# Patient Record
Sex: Male | Born: 1945
Health system: Southern US, Community
[De-identification: ages and names within clinical notes are randomized; demographics above are authoritative.]

## PROBLEM LIST (undated history)

## (undated) DIAGNOSIS — E119 Type 2 diabetes mellitus without complications: Secondary | ICD-10-CM

## (undated) DIAGNOSIS — E785 Hyperlipidemia, unspecified: Secondary | ICD-10-CM

## (undated) DIAGNOSIS — I10 Essential (primary) hypertension: Secondary | ICD-10-CM

## (undated) HISTORY — DX: Type 2 diabetes mellitus without complications: E11.9

## (undated) HISTORY — DX: Hyperlipidemia, unspecified: E78.5

## (undated) HISTORY — DX: Essential (primary) hypertension: I10

## (undated) HISTORY — PX: FOOT SURGERY: SHX648

---

## 2002-06-13 ENCOUNTER — Emergency Department (HOSPITAL_COMMUNITY): Admission: EM | Admit: 2002-06-13 | Discharge: 2002-06-13 | Payer: Self-pay | Admitting: Emergency Medicine

## 2004-09-15 ENCOUNTER — Other Ambulatory Visit: Payer: Self-pay

## 2004-09-15 ENCOUNTER — Emergency Department: Payer: Self-pay | Admitting: Internal Medicine

## 2004-10-25 ENCOUNTER — Ambulatory Visit: Payer: Self-pay | Admitting: Internal Medicine

## 2004-11-21 ENCOUNTER — Ambulatory Visit: Payer: Self-pay | Admitting: Internal Medicine

## 2006-08-24 ENCOUNTER — Emergency Department: Payer: Self-pay | Admitting: Emergency Medicine

## 2006-10-16 ENCOUNTER — Ambulatory Visit: Payer: Self-pay | Admitting: Orthopedic Surgery

## 2007-05-21 ENCOUNTER — Ambulatory Visit: Payer: Self-pay | Admitting: General Surgery
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
## 2009-01-12 ENCOUNTER — Emergency Department: Payer: Self-pay | Admitting: Emergency Medicine

## 2012-01-03 ENCOUNTER — Ambulatory Visit: Payer: Self-pay | Admitting: Family
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
## 2015-05-17 ENCOUNTER — Ambulatory Visit (INDEPENDENT_AMBULATORY_CARE_PROVIDER_SITE_OTHER): Payer: Commercial Managed Care - HMO | Admitting: Family Medicine

## 2015-05-17 ENCOUNTER — Encounter: Payer: Self-pay | Admitting: Family Medicine

## 2015-05-17 VITALS — BP 108/73 | HR 62 | Temp 98.2°F | Resp 18 | Ht 67.0 in | Wt 141.3 lb

## 2015-05-17 DIAGNOSIS — I10 Essential (primary) hypertension: Secondary | ICD-10-CM | POA: Diagnosis not present

## 2015-05-17 DIAGNOSIS — E119 Type 2 diabetes mellitus without complications: Secondary | ICD-10-CM

## 2015-05-17 DIAGNOSIS — Z794 Long term (current) use of insulin: Principal | ICD-10-CM

## 2015-05-17 DIAGNOSIS — F17200 Nicotine dependence, unspecified, uncomplicated: Secondary | ICD-10-CM | POA: Insufficient documentation

## 2015-05-17 DIAGNOSIS — E785 Hyperlipidemia, unspecified: Secondary | ICD-10-CM

## 2015-05-17 MED ORDER — INSULIN LISPRO 100 UNIT/ML (KWIKPEN): 4.0000 [IU] | PEN_INJECTOR | Freq: Two times a day (BID) | SUBCUTANEOUS | Status: DC

## 2015-05-17 MED ORDER — INSULIN GLARGINE 100 UNIT/ML SOLOSTAR PEN: 9.0000 [IU] | PEN_INJECTOR | Freq: Every day | SUBCUTANEOUS | Status: DC

## 2015-05-17 MED ORDER — METFORMIN HCL 1000 MG PO TABS: 1000.0000 mg | ORAL_TABLET | Freq: Two times a day (BID) | ORAL | Status: DC

## 2015-05-17 NOTE — Progress Notes (Signed)
Name: Larry Robinson   MRN: [id number]    DOB: 1946-03-25   Date:05/17/2015       Progress Note  Subjective  Chief Complaint  Chief Complaint  Patient presents with  . Medication Refill    Lantus 100 Unit / Humalog 100 Unit / Metformin 1000mg   . Diabetes    Diabetes He presents for his follow-up diabetic visit. He has type 2 diabetes mellitus. Pertinent negatives for hypoglycemia include no headaches. Pertinent negatives for diabetes include no blurred vision, no chest pain and no weight loss. Pertinent negatives for diabetic complications include no CVA. Current diabetic treatment includes oral agent (monotherapy) and intensive insulin program. His weight is stable. He is following a diabetic and generally healthy diet. He participates in exercise daily. His breakfast blood glucose range is generally 130-140 mg/dl. An ACE inhibitor/angiotensin II receptor blocker is being taken. Eye exam is current.  Hypertension This is a chronic problem. The problem is controlled. Pertinent negatives include no blurred vision, chest pain, headaches, malaise/fatigue, orthopnea, palpitations or shortness of breath. Risk factors for coronary artery disease include dyslipidemia, diabetes mellitus and male gender. Past treatments include ACE inhibitors. There is no history of kidney disease, CAD/MI or CVA.  Hyperlipidemia This is a chronic problem. The problem is controlled. Exacerbating diseases include diabetes. Pertinent negatives include no chest pain, leg pain, myalgias or shortness of breath. Current antihyperlipidemic treatment includes statins.      Past Medical History  Diagnosis Date  . Diabetes mellitus without complication   . Hypertension     Past Surgical History  Procedure Laterality Date  . Foot surgery Right     Family History  Problem Relation Age of Onset  . Hypertension Mother   . Diabetes Father     Social History   Social History  . Marital Status: Single    Spouse  Name: N/A  . Number of Children: N/A  . Years of Education: N/A   Occupational History  . Not on file.   Social History Main Topics  . Smoking status: Current Every Day Smoker -- 10.00 packs/day    Types: Cigarettes  . Smokeless tobacco: Never Used  . Alcohol Use: 0.0 oz/week    0 Standard drinks or equivalent per week     Comment: occasional  . Drug Use: No  . Sexual Activity: Not on file   Other Topics Concern  . Not on file   Social History Narrative  . No narrative on file     Current outpatient prescriptions:  .  Insulin Glargine (LANTUS SOLOSTAR) 100 UNIT/ML Solostar Pen, Inject into the skin., Disp: , Rfl:  .  insulin lispro (HUMALOG KWIKPEN) 100 UNIT/ML KiwkPen, Inject into the skin., Disp: , Rfl:  .  lisinopril (PRINIVIL,ZESTRIL) 10 MG tablet, Take by mouth., Disp: , Rfl:  .  metFORMIN (GLUCOPHAGE) 1000 MG tablet, Take by mouth., Disp: , Rfl:  .  simvastatin (ZOCOR) 20 MG tablet, Take 1 tablet by mouth at bedtime., Disp: , Rfl:   No Known Allergies   Review of Systems  Constitutional: Negative for fever, chills, weight loss and malaise/fatigue.  Eyes: Negative for blurred vision and double vision.  Respiratory: Negative for shortness of breath.   Cardiovascular: Negative for chest pain, palpitations and orthopnea.  Musculoskeletal: Negative for myalgias.  Neurological: Negative for headaches.      Objective  Filed Vitals:   05/17/15 1207  BP: 108/73  Pulse: 62  Temp: 98.2 F (36.8 C)  TempSrc: Oral  Resp:  18  Height:  (1.702 m)  Weight: 141 lb 4.8 oz (64.093 kg)  SpO2: 96%    Physical Exam  Constitutional: He is oriented to person, place, and time and well-developed, well-nourished, and in no distress.  Cardiovascular: Normal rate and regular rhythm.   Pulmonary/Chest: Effort normal and breath sounds normal.  Abdominal: Soft. Bowel sounds are normal.  Neurological: He is alert and oriented to person, place, and time.  Nursing note and  vitals reviewed.   Assessment & Plan  1. Controlled type 2 diabetes mellitus with insulin therapy  - HgB A1c - Insulin Glargine (LANTUS SOLOSTAR) 100 UNIT/ML Solostar Pen; Inject 9 Units into the skin daily at 10 pm.  Dispense: 15 mL; Refill: 2 - insulin lispro (HUMALOG KWIKPEN) 100 UNIT/ML KiwkPen; Inject 0.04 mLs (4 Units total) into the skin 2 (two) times daily.  Dispense: 15 mL; Refill: 1 - metFORMIN (GLUCOPHAGE) 1000 MG tablet; Take 1 tablet (1,000 mg total) by mouth 2 (two) times daily with a meal.  Dispense: 180 tablet; Refill: 0 - Urine Microalbumin w/creat. ratio  2. Essential hypertension Blood pressure is stable and controlled on present therapy.  3. Dyslipidemia  - Lipid Profile - Comprehensive metabolic panel   Leianne Callins [facility] 05/17/2015 12:17 PM

## 2015-05-19 LAB — COMPREHENSIVE METABOLIC PANEL
ALT: 14 IU/L (ref 0–44)
AST: 16 IU/L (ref 0–40)
Albumin/Globulin Ratio: 1.7 (ref 1.1–2.5)
Albumin: 4.2 g/dL (ref 3.6–4.8)
Alkaline Phosphatase: 57 IU/L (ref 39–117)
BUN/Creatinine Ratio: 10 (ref 10–22)
BUN: 13 mg/dL (ref 8–27)
Bilirubin Total: 0.5 mg/dL (ref 0.0–1.2)
CALCIUM: 9.5 mg/dL (ref 8.6–10.2)
CO2: 24 mmol/L (ref 18–29)
CREATININE: 1.32 mg/dL — AB (ref 0.76–1.27)
Chloride: 95 mmol/L — ABNORMAL LOW (ref 97–108)
GFR calc Af Amer: 64 mL/min/{1.73_m2} (ref >59)
GFR, EST NON AFRICAN AMERICAN: 55 mL/min/{1.73_m2} — AB (ref >59)
GLUCOSE: 263 mg/dL — AB (ref 65–99)
Globulin, Total: 2.5 g/dL (ref 1.5–4.5)
POTASSIUM: 4.9 mmol/L (ref 3.5–5.2)
Sodium: 136 mmol/L (ref 134–144)
TOTAL PROTEIN: 6.7 g/dL (ref 6.0–8.5)

## 2015-05-19 LAB — LIPID PANEL
Chol/HDL Ratio: 2.6 ratio units (ref 0.0–5.0)
Cholesterol, Total: 176 mg/dL (ref 100–199)
HDL: 68 mg/dL (ref >39)
LDL CALC: 93 mg/dL (ref 0–99)
Triglycerides: 74 mg/dL (ref 0–149)
VLDL Cholesterol Cal: 15 mg/dL (ref 5–40)

## 2015-05-19 LAB — HEMOGLOBIN A1C
Est. average glucose Bld gHb Est-mCnc: 260 mg/dL
HEMOGLOBIN A1C: 10.7 % — AB (ref 4.8–5.6)

## 2015-05-19 LAB — MICROALBUMIN / CREATININE URINE RATIO
CREATININE, UR: 173 mg/dL
MICROALB/CREAT RATIO: 19.2 mg/g creat (ref 0.0–30.0)
Microalbumin, Urine: 33.2 ug/mL

## 2015-05-25 ENCOUNTER — Encounter: Payer: Self-pay | Admitting: Family Medicine

## 2015-06-08 ENCOUNTER — Ambulatory Visit: Payer: Self-pay | Admitting: Family Medicine

## 2015-06-14 ENCOUNTER — Encounter: Payer: Self-pay | Admitting: Family Medicine

## 2015-06-14 ENCOUNTER — Ambulatory Visit (INDEPENDENT_AMBULATORY_CARE_PROVIDER_SITE_OTHER): Payer: Commercial Managed Care - HMO | Admitting: Family Medicine

## 2015-06-14 VITALS — BP 106/78 | HR 63 | Temp 97.9°F | Resp 18 | Ht 67.0 in | Wt 144.1 lb

## 2015-06-14 DIAGNOSIS — Z9181 History of falling: Secondary | ICD-10-CM | POA: Insufficient documentation

## 2015-06-14 DIAGNOSIS — E1165 Type 2 diabetes mellitus with hyperglycemia: Secondary | ICD-10-CM | POA: Diagnosis not present

## 2015-06-14 DIAGNOSIS — IMO0002 Reserved for concepts with insufficient information to code with codable children: Secondary | ICD-10-CM

## 2015-06-14 DIAGNOSIS — Z794 Long term (current) use of insulin: Principal | ICD-10-CM

## 2015-06-14 MED ORDER — INSULIN GLARGINE 100 UNIT/ML SOLOSTAR PEN: 15.0000 [IU] | PEN_INJECTOR | Freq: Every day | SUBCUTANEOUS | Status: DC

## 2015-06-14 NOTE — Progress Notes (Signed)
Name: Larry Robinson   MRN: [id number]    DOB: 09-06-1946   Date:06/14/2015       Progress Note  Subjective  Chief Complaint  Chief Complaint  Patient presents with  . Follow-up    adjust medications  . Diabetes  . Hyperlipidemia  . Hypertension    Diabetes He presents for his follow-up diabetic visit. He has type 2 diabetes mellitus. Pertinent negatives for hypoglycemia include no dizziness, hunger, nervousness/anxiousness or sweats. Associated symptoms include polydipsia. Pertinent negatives for diabetes include no fatigue and no polyuria. There are no hypoglycemic complications. Pertinent negatives for diabetic complications include no CVA, heart disease, nephropathy or retinopathy. Current diabetic treatment includes intensive insulin program and oral agent (monotherapy). He is following a diabetic and generally healthy diet. His breakfast blood glucose range is generally 140-180 mg/dl. His lunch blood glucose range is generally 140-180 mg/dl. An ACE inhibitor/angiotensin II receptor blocker is being taken.   last A1c of 10.7%, patient reports a.m. blood glucose readings in the 140s to 180s range. Currently on basal and bolus insulin along with metformin. No hypoglycemic episodes.  Past Medical History  Diagnosis Date  . Diabetes mellitus without complication   . Hypertension     Past Surgical History  Procedure Laterality Date  . Foot surgery Right     Family History  Problem Relation Age of Onset  . Hypertension Mother   . Diabetes Father     Social History   Social History  . Marital Status: Single    Spouse Name: N/A  . Number of Children: N/A  . Years of Education: N/A   Occupational History  . Not on file.   Social History Main Topics  . Smoking status: Current Every Day Smoker -- 10.00 packs/day    Types: Cigarettes  . Smokeless tobacco: Never Used  . Alcohol Use: 0.0 oz/week    0 Standard drinks or equivalent per week     Comment: occasional  . Drug  Use: No  . Sexual Activity: Not on file   Other Topics Concern  . Not on file   Social History Narrative     Current outpatient prescriptions:  .  AFLURIA PRESERVATIVE FREE 0.5 ML SUSY, inject 0.5 milliliter intramuscularly, Disp: , Rfl: 0 .  Insulin Glargine (LANTUS SOLOSTAR) 100 UNIT/ML Solostar Pen, Inject 9 Units into the skin daily at 10 pm., Disp: 15 mL, Rfl: 2 .  insulin lispro (HUMALOG KWIKPEN) 100 UNIT/ML KiwkPen, Inject 0.04 mLs (4 Units total) into the skin 2 (two) times daily., Disp: 15 mL, Rfl: 1 .  lisinopril (PRINIVIL,ZESTRIL) 10 MG tablet, Take by mouth., Disp: , Rfl:  .  metFORMIN (GLUCOPHAGE) 1000 MG tablet, Take 1 tablet (1,000 mg total) by mouth 2 (two) times daily with a meal., Disp: 180 tablet, Rfl: 0 .  simvastatin (ZOCOR) 20 MG tablet, Take 1 tablet by mouth at bedtime., Disp: , Rfl:   No Known Allergies   Review of Systems  Constitutional: Negative for fatigue.  Neurological: Negative for dizziness.  Endo/Heme/Allergies: Positive for polydipsia.  Psychiatric/Behavioral: The patient is not nervous/anxious.     Objective  Filed Vitals:   06/14/15 0947  BP: 106/78  Pulse: 63  Temp: 97.9 F (36.6 C)  TempSrc: Oral  Resp: 18  Height:  (1.702 m)  Weight: 144 lb 1.6 oz (65.363 kg)  SpO2: 96%    Physical Exam  Constitutional: He is well-developed, well-nourished, and in no distress.  HENT:  Head: Normocephalic and atraumatic.  Cardiovascular: Normal rate and regular rhythm.   Pulmonary/Chest: Effort normal and breath sounds normal.  Nursing note and vitals reviewed.   Assessment & Plan  1. Uncontrolled type 2 diabetes mellitus with insulin therapy Recommended increasing Lantus to 15 units at bedtime, up from his current dose of 9 units. He is to increase by 1 unit every night and closely monitor blood glucose following morning. Review blood glucose logs in one week. Patient is aware of symptoms of hypoglycemia. Recommended maintaining  dietary and lifestyle measures for optimal diabetes control. - Insulin Glargine (LANTUS SOLOSTAR) 100 UNIT/ML Solostar Pen; Inject 15 Units into the skin daily at 10 pm.  Dispense: 15 mL; Refill: 2   Syed [facility] 06/14/2015 10:06 AM

## 2015-06-21 ENCOUNTER — Telehealth: Payer: Self-pay | Admitting: Family Medicine

## 2015-06-21 NOTE — Telephone Encounter (Signed)
Routed to Dr. Shah for advice  

## 2015-06-21 NOTE — Telephone Encounter (Signed)
Pt called wanting to get a update on the [facility] paperwork he brought in last week to be filled out. Pt would like to know how much longer it will be?

## 2015-06-23 NOTE — Telephone Encounter (Signed)
[facility] paperwork completed and ready for pickup

## 2015-07-13 ENCOUNTER — Encounter: Payer: Self-pay | Admitting: Family Medicine

## 2015-07-13 ENCOUNTER — Ambulatory Visit (INDEPENDENT_AMBULATORY_CARE_PROVIDER_SITE_OTHER): Payer: Commercial Managed Care - HMO | Admitting: Family Medicine

## 2015-07-13 DIAGNOSIS — IMO0002 Reserved for concepts with insufficient information to code with codable children: Secondary | ICD-10-CM

## 2015-07-13 DIAGNOSIS — Z794 Long term (current) use of insulin: Secondary | ICD-10-CM

## 2015-07-13 DIAGNOSIS — E1165 Type 2 diabetes mellitus with hyperglycemia: Secondary | ICD-10-CM | POA: Diagnosis not present

## 2015-07-13 MED ORDER — INSULIN GLARGINE 100 UNIT/ML SOLOSTAR PEN: 15.0000 [IU] | PEN_INJECTOR | Freq: Every day | SUBCUTANEOUS | Status: DC

## 2015-07-13 MED ORDER — INSULIN LISPRO 100 UNIT/ML (KWIKPEN): 4.0000 [IU] | PEN_INJECTOR | Freq: Two times a day (BID) | SUBCUTANEOUS | Status: DC

## 2015-07-13 NOTE — Progress Notes (Signed)
Name: Larry GauzeKenneth S Feliciano   MRN: [id number]    DOB: November 14, 1945   Date:07/13/2015       Progress Note  Subjective  Chief Complaint  Chief Complaint  Patient presents with  . Follow-up    1 mo  . Diabetes  . Hyperlipidemia  . Hypertension  . Medication Refill    humalog; lantus    Diabetes He presents for his follow-up diabetic visit. He has type 2 diabetes mellitus. His disease course has been improving. Associated symptoms include polydipsia and polyuria. Pertinent negatives for diabetes include no fatigue. Current diabetic treatment includes intensive insulin program and oral agent (monotherapy). He is following a diabetic diet. His breakfast blood glucose range is generally 140-180 mg/dl. An ACE inhibitor/angiotensin II receptor blocker is being taken.   Past Medical History  Diagnosis Date  . Diabetes mellitus without complication (HCC)   . Hypertension     Past Surgical History  Procedure Laterality Date  . Foot surgery Right     Family History  Problem Relation Age of Onset  . Hypertension Mother   . Diabetes Father     Social History   Social History  . Marital Status: Single    Spouse Name: N/A  . Number of Children: N/A  . Years of Education: N/A   Occupational History  . Not on file.   Social History Main Topics  . Smoking status: Current Every Day Smoker -- 10.00 packs/day    Types: Cigarettes  . Smokeless tobacco: Never Used  . Alcohol Use: 0.0 oz/week    0 Standard drinks or equivalent per week     Comment: occasional  . Drug Use: No  . Sexual Activity: Not on file   Other Topics Concern  . Not on file   Social History Narrative    Current outpatient prescriptions:  .  AFLURIA PRESERVATIVE FREE 0.5 ML SUSY, inject 0.5 milliliter intramuscularly, Disp: , Rfl: 0 .  Insulin Glargine (LANTUS SOLOSTAR) 100 UNIT/ML Solostar Pen, Inject 15 Units into the skin daily at 10 pm., Disp: 15 mL, Rfl: 2 .  insulin lispro (HUMALOG KWIKPEN) 100 UNIT/ML  KiwkPen, Inject 0.04 mLs (4 Units total) into the skin 2 (two) times daily., Disp: 15 mL, Rfl: 1 .  lisinopril (PRINIVIL,ZESTRIL) 10 MG tablet, Take by mouth., Disp: , Rfl:  .  metFORMIN (GLUCOPHAGE) 1000 MG tablet, Take 1 tablet (1,000 mg total) by mouth 2 (two) times daily with a meal., Disp: 180 tablet, Rfl: 0 .  simvastatin (ZOCOR) 20 MG tablet, Take 1 tablet by mouth at bedtime., Disp: , Rfl:   No Known Allergies   Review of Systems  Constitutional: Negative for fatigue.  Endo/Heme/Allergies: Positive for polydipsia.    Objective  Filed Vitals:   07/13/15 1358  BP: 108/68  Pulse: 72  Temp: 98.4 F (36.9 C)  TempSrc: Oral  Resp: 18  Height: 5\' 7"  (1.702 m)  Weight: 138 lb 12.8 oz (62.959 kg)  SpO2: 97%    Physical Exam  Constitutional: He is well-developed, well-nourished, and in no distress.  Cardiovascular: Normal rate and regular rhythm.   No murmur heard. Pulmonary/Chest: Breath sounds normal. He has no wheezes. He has no rales.  Nursing note and vitals reviewed.  Assessment & Plan  1. Uncontrolled type 2 diabetes mellitus with insulin therapy (HCC) Fasting BG readings seem to be improving on the higher dose of Lantus. Recheck A1c in 3 months. Refills for Lantus and Humalog sent to pharmacy. - insulin lispro (HUMALOG KWIKPEN) 100  UNIT/ML KiwkPen; Inject 0.04 mLs (4 Units total) into the skin 2 (two) times daily.  Dispense: 3 pen; Refill: 2 - Insulin Glargine (LANTUS SOLOSTAR) 100 UNIT/ML Solostar Pen; Inject 15 Units into the skin daily at 10 pm.  Dispense: 3 pen; Refill: 2    Arvilla Salada [facility] 07/13/2015 2:02 PM

## 2015-08-15 ENCOUNTER — Encounter: Payer: Self-pay | Admitting: Family Medicine

## 2015-08-15 ENCOUNTER — Ambulatory Visit (INDEPENDENT_AMBULATORY_CARE_PROVIDER_SITE_OTHER): Payer: Commercial Managed Care - HMO | Admitting: Family Medicine

## 2015-08-15 DIAGNOSIS — E1140 Type 2 diabetes mellitus with diabetic neuropathy, unspecified: Secondary | ICD-10-CM | POA: Insufficient documentation

## 2015-08-15 DIAGNOSIS — Z794 Long term (current) use of insulin: Secondary | ICD-10-CM | POA: Diagnosis not present

## 2015-08-15 DIAGNOSIS — E785 Hyperlipidemia, unspecified: Secondary | ICD-10-CM | POA: Diagnosis not present

## 2015-08-15 DIAGNOSIS — I10 Essential (primary) hypertension: Secondary | ICD-10-CM

## 2015-08-15 DIAGNOSIS — E119 Type 2 diabetes mellitus without complications: Secondary | ICD-10-CM | POA: Diagnosis not present

## 2015-08-15 DIAGNOSIS — N521 Erectile dysfunction due to diseases classified elsewhere: Secondary | ICD-10-CM

## 2015-08-15 LAB — GLUCOSE, POCT (MANUAL RESULT ENTRY): POC Glucose: 122 mg/dl — AB (ref 70–99)

## 2015-08-15 LAB — POCT GLYCOSYLATED HEMOGLOBIN (HGB A1C): HEMOGLOBIN A1C: 6.4

## 2015-08-15 MED ORDER — METFORMIN HCL 1000 MG PO TABS: 1000.0000 mg | ORAL_TABLET | Freq: Two times a day (BID) | ORAL | Status: DC

## 2015-08-15 MED ORDER — INSULIN GLARGINE 100 UNIT/ML SOLOSTAR PEN: 10.0000 [IU] | PEN_INJECTOR | Freq: Every day | SUBCUTANEOUS | Status: DC

## 2015-08-15 NOTE — Progress Notes (Signed)
Name: Larry GauzeKenneth S Robinson   MRN: [id number]    DOB: 04/25/1946   Date:08/15/2015       Progress Note  Subjective  Chief Complaint  Chief Complaint  Patient presents with  . Annual Exam    CPE  . Diabetes  . Hyperlipidemia  . Hypertension    Diabetes He presents for his follow-up diabetic visit. He has type 2 diabetes mellitus. His disease course has been stable. There are no hypoglycemic associated symptoms. Pertinent negatives for hypoglycemia include no headaches. Pertinent negatives for diabetes include no blurred vision and no chest pain. Pertinent negatives for diabetic complications include no CVA or heart disease. He is currently taking insulin pre-breakfast, at bedtime and pre-lunch. Insulin injections are given by patient. Rotation sites for injection include the abdominal wall. His weight is stable. He is following a diabetic and generally healthy diet. He participates in exercise three times a week (walks 3x/week for 30 mins each). His breakfast blood glucose range is generally 130-140 mg/dl. An ACE inhibitor/angiotensin II receptor blocker is being taken. He sees a podiatrist.Eye exam is current.  Hyperlipidemia This is a chronic problem. The problem is controlled. Recent lipid tests were reviewed and are normal. Pertinent negatives include no chest pain, leg pain, myalgias or shortness of breath. Current antihyperlipidemic treatment includes statins. The current treatment provides significant improvement of lipids. There are no compliance problems.  Risk factors for coronary artery disease include diabetes mellitus and dyslipidemia.  Hypertension This is a chronic problem. The problem is unchanged. The problem is controlled. Pertinent negatives include no blurred vision, chest pain, headaches, palpitations or shortness of breath. Past treatments include ACE inhibitors. There is no history of kidney disease, CAD/MI or CVA.    Past Medical History  Diagnosis Date  . Diabetes  mellitus without complication (HCC)   . Hypertension     Past Surgical History  Procedure Laterality Date  . Foot surgery Right     Family History  Problem Relation Age of Onset  . Hypertension Mother   . Diabetes Father     Social History   Social History  . Marital Status: Single    Spouse Name: N/A  . Number of Children: N/A  . Years of Education: N/A   Occupational History  . Not on file.   Social History Main Topics  . Smoking status: Current Every Day Smoker -- 10.00 packs/day    Types: Cigarettes  . Smokeless tobacco: Never Used  . Alcohol Use: 0.0 oz/week    0 Standard drinks or equivalent per week     Comment: occasional  . Drug Use: No  . Sexual Activity: Not on file   Other Topics Concern  . Not on file   Social History Narrative     Current outpatient prescriptions:  .  AFLURIA PRESERVATIVE FREE 0.5 ML SUSY, inject 0.5 milliliter intramuscularly, Disp: , Rfl: 0 .  Insulin Glargine (LANTUS SOLOSTAR) 100 UNIT/ML Solostar Pen, Inject 15 Units into the skin daily at 10 pm., Disp: 3 pen, Rfl: 2 .  insulin lispro (HUMALOG KWIKPEN) 100 UNIT/ML KiwkPen, Inject 0.04 mLs (4 Units total) into the skin 2 (two) times daily., Disp: 3 pen, Rfl: 2 .  lisinopril (PRINIVIL,ZESTRIL) 10 MG tablet, Take by mouth., Disp: , Rfl:  .  metFORMIN (GLUCOPHAGE) 1000 MG tablet, Take 1 tablet (1,000 mg total) by mouth 2 (two) times daily with a meal., Disp: 180 tablet, Rfl: 0 .  simvastatin (ZOCOR) 20 MG tablet, Take 1 tablet by mouth  at bedtime., Disp: , Rfl:   No Known Allergies   Review of Systems  Eyes: Negative for blurred vision.  Respiratory: Negative for shortness of breath.   Cardiovascular: Negative for chest pain and palpitations.  Musculoskeletal: Negative for myalgias.  Neurological: Negative for headaches.  All other systems reviewed and are negative.   Objective  Filed Vitals:   08/15/15 1333  BP: 110/70  Pulse: 71  Temp: 98.5 F (36.9 C)  TempSrc:  Oral  Resp: 16  Height:  (1.702 m)  Weight: 140 lb 1.6 oz (63.549 kg)  SpO2: 96%    Physical Exam  Constitutional: He is oriented to person, place, and time and well-developed, well-nourished, and in no distress.  HENT:  Head: Normocephalic and atraumatic.  Eyes: Conjunctivae are normal. Pupils are equal, round, and reactive to light.  Cardiovascular: Normal rate, regular rhythm and normal heart sounds.   Pulmonary/Chest: Effort normal and breath sounds normal.  Abdominal: Soft. Bowel sounds are normal.  Musculoskeletal: He exhibits no edema.  Neurological: He is alert and oriented to person, place, and time.  Skin: Skin is warm and dry.  Nursing note and vitals reviewed.   Assessment & Plan  1. Dyslipidemia Obtain FLP and follow-up. - Lipid Profile - Comprehensive Metabolic Panel (CMET)  2. Essential hypertension BP well controlled on present therapy.  3. Controlled type 2 diabetes mellitus without complication, with long-term current use of insulin (HCC) A1c has improved from 10.7% is 6.4% since August 2016. Advised to decrease Lantus to 10 units at bedtime. Continue on same dosage of mealtime insulin and metformin. Continue with dietary and lifestyle therapy as well. Recheck A1c in 3-4 months. - POCT HgB A1C - POCT Glucose (CBG) - Insulin Glargine (LANTUS SOLOSTAR) 100 UNIT/ML Solostar Pen; Inject 10 Units into the skin daily at 10 pm.  Dispense: 3 pen; Refill: 2     Lysette Lindenbaum [facility] 08/15/2015 1:45 PM

## 2015-08-25 LAB — COMPREHENSIVE METABOLIC PANEL WITH GFR
ALT: 18 IU/L (ref 0–44)
AST: 19 IU/L (ref 0–40)
Albumin/Globulin Ratio: 1.6 (ref 1.1–2.5)
Albumin: 4 g/dL (ref 3.6–4.8)
Alkaline Phosphatase: 45 IU/L (ref 39–117)
BUN/Creatinine Ratio: 17 (ref 10–22)
BUN: 19 mg/dL (ref 8–27)
Bilirubin Total: 0.5 mg/dL (ref 0.0–1.2)
CO2: 27 mmol/L (ref 18–29)
Calcium: 9.2 mg/dL (ref 8.6–10.2)
Chloride: 98 mmol/L (ref 97–106)
Creatinine, Ser: 1.14 mg/dL (ref 0.76–1.27)
GFR calc Af Amer: 77 mL/min/1.73
GFR calc non Af Amer: 66 mL/min/1.73
Globulin, Total: 2.5 g/dL (ref 1.5–4.5)
Glucose: 178 mg/dL — ABNORMAL HIGH (ref 65–99)
Potassium: 5 mmol/L (ref 3.5–5.2)
Sodium: 137 mmol/L (ref 136–144)
Total Protein: 6.5 g/dL (ref 6.0–8.5)

## 2015-08-25 LAB — LIPID PANEL
Chol/HDL Ratio: 2.2 ratio units (ref 0.0–5.0)
Cholesterol, Total: 153 mg/dL (ref 100–199)
HDL: 70 mg/dL (ref >39)
LDL CALC: 67 mg/dL (ref 0–99)
Triglycerides: 78 mg/dL (ref 0–149)
VLDL CHOLESTEROL CAL: 16 mg/dL (ref 5–40)

## 2015-09-21 ENCOUNTER — Encounter: Payer: Commercial Managed Care - HMO | Admitting: Family Medicine

## 2015-10-16 ENCOUNTER — Other Ambulatory Visit: Payer: Self-pay | Admitting: Family Medicine

## 2015-10-18 NOTE — Telephone Encounter (Signed)
Medication has been refilled and sent to [facility] [address] 

## 2015-10-23 ENCOUNTER — Ambulatory Visit (INDEPENDENT_AMBULATORY_CARE_PROVIDER_SITE_OTHER): Payer: PPO | Admitting: Family Medicine

## 2015-10-23 ENCOUNTER — Encounter: Payer: Self-pay | Admitting: Family Medicine

## 2015-10-23 VITALS — BP 112/69 | HR 64 | Temp 97.8°F | Resp 17 | Ht 67.0 in | Wt 147.4 lb

## 2015-10-23 DIAGNOSIS — L02212 Cutaneous abscess of back [any part, except buttock]: Secondary | ICD-10-CM | POA: Diagnosis not present

## 2015-10-23 MED ORDER — CLINDAMYCIN HCL 300 MG PO CAPS: 300.0000 mg | ORAL_CAPSULE | Freq: Four times a day (QID) | ORAL | Status: DC

## 2015-10-23 NOTE — Progress Notes (Signed)
Name: Larry Robinson   MRN: [id number]    DOB: 1946/05/13   Date:10/23/2015       Progress Note  Subjective  Chief Complaint  Chief Complaint  Patient presents with  . Follow-up    1 MO  . Diabetes  . Hyperlipidemia    HPI  Pt. Presents for evaluation of a mass on his left upper back, noticed it 4 days ago, started as a small raised area, then he scratched it and noticed it got bigger and they are also noticed some discharge. Seen some blood on his back yesterday (presumably from the mass). No fevers, chills, nausea, vomiting, or other symptoms.   Past Medical History  Diagnosis Date  . Hypertension     Past Surgical History  Procedure Laterality Date  . Foot surgery Right     Family History  Problem Relation Age of Onset  . Hypertension Mother   . Diabetes Father     Social History   Social History  . Marital Status: Single    Spouse Name: N/A  . Number of Children: N/A  . Years of Education: N/A   Occupational History  . Not on file.   Social History Main Topics  . Smoking status: Current Every Day Smoker -- 10.00 packs/day    Types: Cigarettes  . Smokeless tobacco: Never Used  . Alcohol Use: 0.0 oz/week    0 Standard drinks or equivalent per week     Comment: occasional  . Drug Use: No  . Sexual Activity: Not on file   Other Topics Concern  . Not on file   Social History Narrative     Current outpatient prescriptions:  .  AFLURIA PRESERVATIVE FREE 0.5 ML SUSY, inject 0.5 milliliter intramuscularly, Disp: , Rfl: 0 .  Insulin Glargine (LANTUS SOLOSTAR) 100 UNIT/ML Solostar Pen, Inject 10 Units into the skin daily at 10 pm., Disp: 3 pen, Rfl: 2 .  insulin lispro (HUMALOG KWIKPEN) 100 UNIT/ML KiwkPen, Inject 0.04 mLs (4 Units total) into the skin 2 (two) times daily., Disp: 15 mL, Rfl: 2 .  lisinopril (PRINIVIL,ZESTRIL) 10 MG tablet, Take by mouth., Disp: , Rfl:  .  metFORMIN (GLUCOPHAGE) 1000 MG tablet, Take 1 tablet (1,000 mg total) by mouth 2  (two) times daily with a meal., Disp: 180 tablet, Rfl: 0 .  simvastatin (ZOCOR) 20 MG tablet, Take 1 tablet by mouth at bedtime., Disp: , Rfl:   No Known Allergies   Review of Systems  Constitutional: Negative for fever, chills, weight loss and malaise/fatigue.    Objective  Filed Vitals:   10/23/15 1051  BP: 112/69  Pulse: 64  Temp: 97.8 F (36.6 C)  TempSrc: Oral  Resp: 17  Height:  (1.702 m)  Weight: 147 lb 6.4 oz (66.86 kg)  SpO2: 93%    Physical Exam  Skin: Skin is warm. Lesion noted.  Round, raised, erythematous, non-tender, non draining lesion on the left upper back, appearance most c/w a non-fluctuant, indurated abscess  Nursing note and vitals reviewed.      Assessment & Plan  1. Abscess of back We will start on ABX therapy with MRSA coverage. If symptoms do not improve, he may be referred to dermatology for I&D. - clindamycin (CLEOCIN) 300 MG capsule; Take 1 capsule (300 mg total) by mouth 4 (four) times daily.  Dispense: 40 capsule; Refill: 0   Krishawn Vanderweele [facility] 10/23/2015 11:07 AM

## 2015-11-06 ENCOUNTER — Ambulatory Visit (INDEPENDENT_AMBULATORY_CARE_PROVIDER_SITE_OTHER): Payer: PPO | Admitting: Family Medicine

## 2015-11-06 ENCOUNTER — Encounter: Payer: Self-pay | Admitting: Family Medicine

## 2015-11-06 VITALS — BP 112/66 | HR 68 | Temp 99.0°F | Resp 17 | Ht 67.0 in | Wt 145.3 lb

## 2015-11-06 DIAGNOSIS — L02212 Cutaneous abscess of back [any part, except buttock]: Secondary | ICD-10-CM

## 2015-11-06 MED ORDER — CLINDAMYCIN HCL 300 MG PO CAPS: 300.0000 mg | ORAL_CAPSULE | Freq: Four times a day (QID) | ORAL | Status: DC

## 2015-11-06 NOTE — Progress Notes (Signed)
Name: Larry Robinson   MRN: [id number]    DOB: December 16, 1945   Date:11/06/2015       Progress Note  Subjective  Chief Complaint  Chief Complaint  Patient presents with  . Follow-up  . Diabetes  . Hyperlipidemia    HPI  Pt. Is here to reassess an abscess on his right upper back. Appeared spontaneously 2 weeks ago, scratched it and it bled. He was seen 2 weeks ago, was started on Clindamycin 300 mg 4 times daily for presumptive abscess. However, pt. Only took it for 4 days and then stopped.  He is here today for re-evaluation. The abscess looks better, no bleeding, no drainage, no pain on palpation, has gone down in size.  Reports no fevers or chills.  Past Medical History  Diagnosis Date  . Hypertension     Past Surgical History  Procedure Laterality Date  . Foot surgery Right     Family History  Problem Relation Age of Onset  . Hypertension Mother   . Diabetes Father     Social History   Social History  . Marital Status: Single    Spouse Name: N/A  . Number of Children: N/A  . Years of Education: N/A   Occupational History  . Not on file.   Social History Main Topics  . Smoking status: Current Every Day Smoker -- 10.00 packs/day    Types: Cigarettes  . Smokeless tobacco: Never Used  . Alcohol Use: 0.0 oz/week    0 Standard drinks or equivalent per week     Comment: occasional  . Drug Use: No  . Sexual Activity: Not on file   Other Topics Concern  . Not on file   Social History Narrative     Current outpatient prescriptions:  .  AFLURIA PRESERVATIVE FREE 0.5 ML SUSY, inject 0.5 milliliter intramuscularly, Disp: , Rfl: 0 .  Insulin Glargine (LANTUS SOLOSTAR) 100 UNIT/ML Solostar Pen, Inject 10 Units into the skin daily at 10 pm., Disp: 3 pen, Rfl: 2 .  insulin lispro (HUMALOG KWIKPEN) 100 UNIT/ML KiwkPen, Inject 0.04 mLs (4 Units total) into the skin 2 (two) times daily., Disp: 15 mL, Rfl: 2 .  lisinopril (PRINIVIL,ZESTRIL) 10 MG tablet, Take by  mouth., Disp: , Rfl:  .  metFORMIN (GLUCOPHAGE) 1000 MG tablet, Take 1 tablet (1,000 mg total) by mouth 2 (two) times daily with a meal., Disp: 180 tablet, Rfl: 0 .  simvastatin (ZOCOR) 20 MG tablet, Take 1 tablet by mouth at bedtime., Disp: , Rfl:  .  clindamycin (CLEOCIN) 300 MG capsule, Take 1 capsule (300 mg total) by mouth 4 (four) times daily. (Patient not taking: Reported on 11/06/2015), Disp: 40 capsule, Rfl: 0  No Known Allergies   Review of Systems  Constitutional: Negative for fever and chills.  Skin: Negative for itching and rash.     Objective  Filed Vitals:   11/06/15 1318  BP: 112/66  Pulse: 68  Temp: 99 F (37.2 C)  TempSrc: Oral  Resp: 17  Height:  (1.702 m)  Weight: 145 lb 4.8 oz (65.908 kg)  SpO2: 95%    Physical Exam  Skin: Lesion noted.     papulo-pustular erythematous, indurated, non-tender lesion on the right upper back, decreased in size and depth.  Nursing note and vitals reviewed.     Assessment & Plan  1. Abscess of back Restart Clindamycin due to interruption in therapy. Abscess improved. Follow up in 2 weeks. - clindamycin (CLEOCIN) 300 MG capsule; Take  1 capsule (300 mg total) by mouth 4 (four) times daily.  Dispense: 40 capsule; Refill: 0   Lorimer Tiberio [facility] 11/06/2015 1:25 PM

## 2015-11-20 ENCOUNTER — Encounter: Payer: Self-pay | Admitting: Family Medicine

## 2015-11-20 ENCOUNTER — Ambulatory Visit (INDEPENDENT_AMBULATORY_CARE_PROVIDER_SITE_OTHER): Payer: PPO | Admitting: Family Medicine

## 2015-11-20 VITALS — BP 110/62 | HR 69 | Temp 98.8°F | Resp 18 | Ht 67.0 in | Wt 140.5 lb

## 2015-11-20 DIAGNOSIS — Z794 Long term (current) use of insulin: Secondary | ICD-10-CM

## 2015-11-20 DIAGNOSIS — L02212 Cutaneous abscess of back [any part, except buttock]: Secondary | ICD-10-CM

## 2015-11-20 DIAGNOSIS — E119 Type 2 diabetes mellitus without complications: Secondary | ICD-10-CM | POA: Diagnosis not present

## 2015-11-20 LAB — POCT GLYCOSYLATED HEMOGLOBIN (HGB A1C): HEMOGLOBIN A1C: 6.5

## 2015-11-20 LAB — GLUCOSE, POCT (MANUAL RESULT ENTRY): POC GLUCOSE: 212 mg/dL — AB (ref 70–99)

## 2015-11-20 MED ORDER — INSULIN GLARGINE 100 UNIT/ML SOLOSTAR PEN: 10.0000 [IU] | PEN_INJECTOR | Freq: Every day | SUBCUTANEOUS | Status: DC

## 2015-11-20 MED ORDER — INSULIN ASPART 100 UNIT/ML FLEXPEN: 4.0000 [IU] | PEN_INJECTOR | Freq: Two times a day (BID) | SUBCUTANEOUS | Status: DC

## 2015-11-20 MED ORDER — METFORMIN HCL 1000 MG PO TABS: 1000.0000 mg | ORAL_TABLET | Freq: Two times a day (BID) | ORAL | Status: DC

## 2015-11-20 NOTE — Progress Notes (Signed)
Name: Larry Robinson   MRN: [id number]    DOB: 01-Aug-1946   Date:11/20/2015       Progress Note  Subjective  Chief Complaint  Chief Complaint  Patient presents with  . abscess on back    2 week follow up    Diabetes He presents for his follow-up diabetic visit. He has type 2 diabetes mellitus. His disease course has been stable. There are no hypoglycemic associated symptoms. Associated symptoms include polyuria. Pertinent negatives for diabetes include no blurred vision, no fatigue and no polydipsia. Symptoms are stable. Current diabetic treatment includes oral agent (monotherapy) and insulin injections. He is following a diabetic diet. His breakfast blood glucose range is generally 130-140 mg/dl. An ACE inhibitor/angiotensin II receptor blocker is being taken.     Past Medical History  Diagnosis Date  . Hypertension     Past Surgical History  Procedure Laterality Date  . Foot surgery Right     Family History  Problem Relation Age of Onset  . Hypertension Mother   . Diabetes Father     Social History   Social History  . Marital Status: Single    Spouse Name: N/A  . Number of Children: N/A  . Years of Education: N/A   Occupational History  . Not on file.   Social History Main Topics  . Smoking status: Current Every Day Smoker -- 10.00 packs/day    Types: Cigarettes  . Smokeless tobacco: Never Used  . Alcohol Use: 0.0 oz/week    0 Standard drinks or equivalent per week     Comment: occasional  . Drug Use: No  . Sexual Activity: Not on file   Other Topics Concern  . Not on file   Social History Narrative     Current outpatient prescriptions:  .  AFLURIA PRESERVATIVE FREE 0.5 ML SUSY, inject 0.5 milliliter intramuscularly, Disp: , Rfl: 0 .  clindamycin (CLEOCIN) 300 MG capsule, Take 1 capsule (300 mg total) by mouth 4 (four) times daily., Disp: 40 capsule, Rfl: 0 .  Insulin Glargine (LANTUS SOLOSTAR) 100 UNIT/ML Solostar Pen, Inject 10 Units into the skin  daily at 10 pm., Disp: 3 pen, Rfl: 2 .  insulin lispro (HUMALOG KWIKPEN) 100 UNIT/ML KiwkPen, Inject 0.04 mLs (4 Units total) into the skin 2 (two) times daily., Disp: 15 mL, Rfl: 2 .  lisinopril (PRINIVIL,ZESTRIL) 10 MG tablet, Take by mouth., Disp: , Rfl:  .  metFORMIN (GLUCOPHAGE) 1000 MG tablet, Take 1 tablet (1,000 mg total) by mouth 2 (two) times daily with a meal., Disp: 180 tablet, Rfl: 0 .  simvastatin (ZOCOR) 20 MG tablet, Take 1 tablet by mouth at bedtime., Disp: , Rfl:   No Known Allergies   Review of Systems  Constitutional: Negative for fatigue.  Eyes: Negative for blurred vision and double vision.  Gastrointestinal: Negative for abdominal pain.  Endo/Heme/Allergies: Negative for polydipsia.     Objective  Filed Vitals:   11/20/15 1600  BP: 110/62  Pulse: 69  Temp: 98.8 F (37.1 C)  Resp: 18  Height:  (1.702 m)  Weight: 140 lb 8 oz (63.73 kg)  SpO2: 99%    Physical Exam  Constitutional: He is oriented to person, place, and time and well-developed, well-nourished, and in no distress.  Cardiovascular: Normal rate and regular rhythm.   Pulmonary/Chest: Effort normal and breath sounds normal.  Neurological: He is alert and oriented to person, place, and time.  Skin: Skin is warm and dry. Lesion noted.  Healed abscess  on the left upper back, minimal induration, no drainage, surrounding erythema resolved, no tenderness to palpation.  Nursing note and vitals reviewed.     Assessment & Plan  1. Controlled type 2 diabetes mellitus without complication, with long-term current use of insulin (HCC) A1c at goal. Continue present basal and bolus insulin therapy along with metformin. Recheck in 3 months. - Insulin Glargine (LANTUS SOLOSTAR) 100 UNIT/ML Solostar Pen; Inject 10 Units into the skin daily at 10 pm.  Dispense: 3 pen; Refill: 2 - metFORMIN (GLUCOPHAGE) 1000 MG tablet; Take 1 tablet (1,000 mg total) by mouth 2 (two) times daily with a meal.  Dispense: 180  tablet; Refill: 0 - insulin aspart (NOVOLOG) 100 UNIT/ML FlexPen; Inject 4 Units into the skin 2 (two) times daily with a meal.  Dispense: 15 mL; Refill: 2  2. Abscess of back Resolved. Completed antibiotic therapy.   Almarie Kurdziel [facility] 11/20/2015 4:28 PM

## 2016-01-03 ENCOUNTER — Encounter: Payer: Self-pay | Admitting: Family Medicine

## 2016-01-03 ENCOUNTER — Ambulatory Visit (INDEPENDENT_AMBULATORY_CARE_PROVIDER_SITE_OTHER): Payer: PPO | Admitting: Family Medicine

## 2016-01-03 VITALS — BP 108/60 | HR 86 | Temp 98.2°F | Resp 16 | Ht 67.0 in | Wt 128.4 lb

## 2016-01-03 DIAGNOSIS — A0472 Enterocolitis due to Clostridium difficile, not specified as recurrent: Secondary | ICD-10-CM | POA: Insufficient documentation

## 2016-01-03 DIAGNOSIS — A09 Infectious gastroenteritis and colitis, unspecified: Secondary | ICD-10-CM | POA: Diagnosis not present

## 2016-01-03 DIAGNOSIS — R197 Diarrhea, unspecified: Secondary | ICD-10-CM

## 2016-01-03 NOTE — Progress Notes (Signed)
Name: Larry GauzeKenneth S Hevia   MRN: [id number]    DOB: July 20, 1946   Date:01/03/2016       Progress Note  Subjective  Chief Complaint  Chief Complaint  Patient presents with  . Diarrhea    GI Upset-Onset-2 weeks, constantly-abdominal pain-left lower side, and has lost 20 pounds in the past 2 months. Foul smell stool and looked gel like, a little blood in the toilet. Patient denies any vomitting and fever.     Diarrhea  This is a new problem. The current episode started 1 to 4 weeks ago (2 weeks ago). The problem occurs 2 to 4 times per day. The problem has been gradually improving. The stool consistency is described as watery and mucous (brown colred loose stool with 'gels'). Pertinent negatives include no abdominal pain, bloating, fever, myalgias or vomiting. He has tried anti-motility drug (Has taken imodium which improved his symptoms.) for the symptoms.   Pertinent to mention that he was recently on antibiotics (clindamycin) for an abscess on his back. Has finished course of antibiotic therapy.  Past Medical History  Diagnosis Date  . Hypertension     Past Surgical History  Procedure Laterality Date  . Foot surgery Right     Family History  Problem Relation Age of Onset  . Hypertension Mother   . Diabetes Father     Social History   Social History  . Marital Status: Single    Spouse Name: N/A  . Number of Children: N/A  . Years of Education: N/A   Occupational History  . Not on file.   Social History Main Topics  . Smoking status: Current Every Day Smoker -- 10.00 packs/day    Types: Cigarettes  . Smokeless tobacco: Never Used  . Alcohol Use: 0.0 oz/week    0 Standard drinks or equivalent per week     Comment: occasional  . Drug Use: No  . Sexual Activity: Not on file   Other Topics Concern  . Not on file   Social History Narrative     Current outpatient prescriptions:  .  insulin aspart (NOVOLOG) 100 UNIT/ML FlexPen, Inject 4 Units into the skin 2 (two)  times daily with a meal., Disp: 15 mL, Rfl: 2 .  Insulin Glargine (LANTUS SOLOSTAR) 100 UNIT/ML Solostar Pen, Inject 10 Units into the skin daily at 10 pm., Disp: 3 pen, Rfl: 2 .  lisinopril (PRINIVIL,ZESTRIL) 10 MG tablet, Take by mouth., Disp: , Rfl:  .  metFORMIN (GLUCOPHAGE) 1000 MG tablet, Take 1 tablet (1,000 mg total) by mouth 2 (two) times daily with a meal., Disp: 180 tablet, Rfl: 0 .  simvastatin (ZOCOR) 20 MG tablet, Take 1 tablet by mouth at bedtime., Disp: , Rfl:   No Known Allergies   Review of Systems  Constitutional: Negative for fever.  Gastrointestinal: Positive for diarrhea. Negative for nausea, vomiting, abdominal pain and bloating.  Musculoskeletal: Negative for myalgias.    Objective  Filed Vitals:   01/03/16 0908  BP: 108/60  Pulse: 86  Temp: 98.2 F (36.8 C)  TempSrc: Oral  Resp: 16  Height: 5\' 7"  (1.702 m)  Weight: 128 lb 6.4 oz (58.242 kg)  SpO2: 96%    Physical Exam  Constitutional: He is oriented to person, place, and time.  Cardiovascular: Normal rate and regular rhythm.   Pulmonary/Chest: Effort normal and breath sounds normal.  Abdominal: Soft. Bowel sounds are normal. There is no tenderness.  Neurological: He is alert and oriented to person, place, and time.  Nursing  note and vitals reviewed.     Assessment & Plan  1. Diarrhea of presumed infectious origin Likely infectious, obtain C. difficile toxin assay, white count, and stool culture. May take Imodium as needed. - Stool C-Diff Toxin Assay - Stool Culture - CBC with Differential   Rogelio Winbush [facility] 01/03/2016 9:31 AM

## 2016-01-04 LAB — CBC WITH DIFFERENTIAL/PLATELET
BASOS ABS: 0 10*3/uL (ref 0.0–0.2)
Basos: 0 %
EOS (ABSOLUTE): 0.2 10*3/uL (ref 0.0–0.4)
Eos: 3 %
Hematocrit: 48.1 % (ref 37.5–51.0)
Hemoglobin: 16.8 g/dL (ref 12.6–17.7)
Immature Grans (Abs): 0 10*3/uL (ref 0.0–0.1)
Immature Granulocytes: 1 %
LYMPHS ABS: 1.6 10*3/uL (ref 0.7–3.1)
Lymphs: 26 %
MCH: 32.9 pg (ref 26.6–33.0)
MCHC: 34.9 g/dL (ref 31.5–35.7)
MCV: 94 fL (ref 79–97)
MONOS ABS: 0.6 10*3/uL (ref 0.1–0.9)
Monocytes: 10 %
Neutrophils Absolute: 3.6 10*3/uL (ref 1.4–7.0)
Neutrophils: 60 %
PLATELETS: 194 10*3/uL (ref 150–379)
RBC: 5.1 x10E6/uL (ref 4.14–5.80)
RDW: 13.2 % (ref 12.3–15.4)
WBC: 6.1 10*3/uL (ref 3.4–10.8)

## 2016-01-05 DIAGNOSIS — A09 Infectious gastroenteritis and colitis, unspecified: Secondary | ICD-10-CM | POA: Diagnosis not present

## 2016-01-09 LAB — STOOL CULTURE: E coli, Shiga toxin Assay: NEGATIVE

## 2016-01-09 LAB — CLOSTRIDIUM DIFFICILE EIA: C DIFFICILE TOXINS A+ B, EIA: POSITIVE — AB

## 2016-01-10 ENCOUNTER — Other Ambulatory Visit: Payer: Self-pay | Admitting: Family Medicine

## 2016-01-10 DIAGNOSIS — A0472 Enterocolitis due to Clostridium difficile, not specified as recurrent: Secondary | ICD-10-CM

## 2016-01-10 MED ORDER — METRONIDAZOLE 500 MG PO TABS: 500.0000 mg | ORAL_TABLET | Freq: Three times a day (TID) | ORAL | Status: DC

## 2016-02-20 ENCOUNTER — Encounter: Payer: Self-pay | Admitting: Family Medicine

## 2016-02-20 ENCOUNTER — Ambulatory Visit (INDEPENDENT_AMBULATORY_CARE_PROVIDER_SITE_OTHER): Payer: PPO | Admitting: Family Medicine

## 2016-02-20 VITALS — BP 108/71 | HR 60 | Temp 98.3°F | Resp 15 | Ht 67.0 in | Wt 136.5 lb

## 2016-02-20 DIAGNOSIS — Z794 Long term (current) use of insulin: Secondary | ICD-10-CM | POA: Diagnosis not present

## 2016-02-20 DIAGNOSIS — E119 Type 2 diabetes mellitus without complications: Secondary | ICD-10-CM | POA: Diagnosis not present

## 2016-02-20 DIAGNOSIS — E785 Hyperlipidemia, unspecified: Secondary | ICD-10-CM

## 2016-02-20 LAB — POCT GLYCOSYLATED HEMOGLOBIN (HGB A1C): HEMOGLOBIN A1C: 6

## 2016-02-20 LAB — GLUCOSE, POCT (MANUAL RESULT ENTRY): POC Glucose: 90 mg/dl (ref 70–99)

## 2016-02-20 MED ORDER — INSULIN ASPART 100 UNIT/ML FLEXPEN: 4.0000 [IU] | PEN_INJECTOR | Freq: Two times a day (BID) | SUBCUTANEOUS | Status: DC

## 2016-02-20 MED ORDER — INSULIN GLARGINE 100 UNIT/ML SOLOSTAR PEN: 10.0000 [IU] | PEN_INJECTOR | Freq: Every day | SUBCUTANEOUS | Status: DC

## 2016-02-20 MED ORDER — LISINOPRIL 10 MG PO TABS
10.0000 mg | ORAL_TABLET | Freq: Every day | ORAL | Status: DC
Start: 2016-02-20 — End: 2016-05-29

## 2016-02-20 MED ORDER — METFORMIN HCL 1000 MG PO TABS: 1000.0000 mg | ORAL_TABLET | Freq: Two times a day (BID) | ORAL | Status: DC

## 2016-02-20 NOTE — Progress Notes (Signed)
Name: Larry Robinson   MRN: [id number]    DOB: 1946/06/12   Date:02/20/2016       Progress Note  Subjective  Chief Complaint  Chief Complaint  Patient presents with  . Follow-up    3 mo  . Diabetes  . Medication Refill    lisinopril 10 mg / insulin novolog / lantus    Diabetes He presents for his follow-up diabetic visit. He has type 2 diabetes mellitus. His disease course has been stable. There are no hypoglycemic associated symptoms. Pertinent negatives for diabetes include no chest pain. Current diabetic treatment includes intensive insulin program and oral agent (monotherapy). An ACE inhibitor/angiotensin II receptor blocker is being taken. Eye exam is current.  Hyperlipidemia This is a chronic problem. The problem is controlled. Recent lipid tests were reviewed and are normal. Pertinent negatives include no chest pain, leg pain, myalgias or shortness of breath. Current antihyperlipidemic treatment includes statins.    Past Medical History  Diagnosis Date  . Hypertension     Past Surgical History  Procedure Laterality Date  . Foot surgery Right     Family History  Problem Relation Age of Onset  . Hypertension Mother   . Diabetes Father     Social History   Social History  . Marital Status: Single    Spouse Name: N/A  . Number of Children: N/A  . Years of Education: N/A   Occupational History  . Not on file.   Social History Main Topics  . Smoking status: Current Every Day Smoker -- 10.00 packs/day    Types: Cigarettes  . Smokeless tobacco: Never Used  . Alcohol Use: 0.0 oz/week    0 Standard drinks or equivalent per week     Comment: occasional  . Drug Use: No  . Sexual Activity: Not on file   Other Topics Concern  . Not on file   Social History Narrative     Current outpatient prescriptions:  .  insulin aspart (NOVOLOG) 100 UNIT/ML FlexPen, Inject 4 Units into the skin 2 (two) times daily with a meal., Disp: 15 mL, Rfl: 2 .  Insulin Glargine  (LANTUS SOLOSTAR) 100 UNIT/ML Solostar Pen, Inject 10 Units into the skin daily at 10 pm., Disp: 3 pen, Rfl: 2 .  lisinopril (PRINIVIL,ZESTRIL) 10 MG tablet, Take by mouth., Disp: , Rfl:  .  metFORMIN (GLUCOPHAGE) 1000 MG tablet, Take 1 tablet (1,000 mg total) by mouth 2 (two) times daily with a meal., Disp: 180 tablet, Rfl: 0 .  metroNIDAZOLE (FLAGYL) 500 MG tablet, Take 1 tablet (500 mg total) by mouth 3 (three) times daily., Disp: 30 tablet, Rfl: 0 .  simvastatin (ZOCOR) 20 MG tablet, Take 1 tablet by mouth at bedtime., Disp: , Rfl:   No Known Allergies   Review of Systems  Respiratory: Negative for shortness of breath.   Cardiovascular: Negative for chest pain.  Musculoskeletal: Negative for myalgias.     Objective  Filed Vitals:   02/20/16 1004  BP: 108/71  Pulse: 60  Temp: 98.3 F (36.8 C)  TempSrc: Oral  Resp: 15  Height: 5\' 7"  (1.702 m)  Weight: 136 lb 8 oz (61.916 kg)  SpO2: 97%    Physical Exam  Constitutional: He is oriented to person, place, and time and well-developed, well-nourished, and in no distress.  HENT:  Head: Normocephalic and atraumatic.  Cardiovascular: Normal rate and regular rhythm.   Pulmonary/Chest: Effort normal and breath sounds normal.  Neurological: He is alert and oriented to person,  place, and time.  Nursing note and vitals reviewed.      Assessment & Plan  1. Controlled type 2 diabetes mellitus without complication, with long-term current use of insulin (HCC) A1c is at goal, well-controlled diabetes. Continue on present therapy - insulin aspart (NOVOLOG) 100 UNIT/ML FlexPen; Inject 4 Units into the skin 2 (two) times daily with a meal.  Dispense: 15 mL; Refill: 2 - Insulin Glargine (LANTUS SOLOSTAR) 100 UNIT/ML Solostar Pen; Inject 10 Units into the skin daily at 10 pm.  Dispense: 3 pen; Refill: 2 - lisinopril (PRINIVIL,ZESTRIL) 10 MG tablet; Take 1 tablet (10 mg total) by mouth daily.  Dispense: 90 tablet; Refill: 0 - metFORMIN  (GLUCOPHAGE) 1000 MG tablet; Take 1 tablet (1,000 mg total) by mouth 2 (two) times daily with a meal.  Dispense: 180 tablet; Refill: 0 - POCT HgB A1C - POCT Glucose (CBG)  2. Dyslipidemia  - Lipid Profile - Comprehensive Metabolic Panel (CMET)   Abas Leicht [facility] 02/20/2016 10:08 AM

## 2016-02-21 LAB — LIPID PANEL
CHOL/HDL RATIO: 1.9 ratio (ref 0.0–5.0)
Cholesterol, Total: 150 mg/dL (ref 100–199)
HDL: 80 mg/dL (ref >39)
LDL CALC: 61 mg/dL (ref 0–99)
TRIGLYCERIDES: 47 mg/dL (ref 0–149)
VLDL Cholesterol Cal: 9 mg/dL (ref 5–40)

## 2016-02-21 LAB — COMPREHENSIVE METABOLIC PANEL
A/G RATIO: 2.1 (ref 1.2–2.2)
ALT: 12 IU/L (ref 0–44)
AST: 17 IU/L (ref 0–40)
Albumin: 4.2 g/dL (ref 3.6–4.8)
Alkaline Phosphatase: 38 IU/L — ABNORMAL LOW (ref 39–117)
BILIRUBIN TOTAL: 0.4 mg/dL (ref 0.0–1.2)
BUN/Creatinine Ratio: 24 (ref 10–24)
BUN: 24 mg/dL (ref 8–27)
CALCIUM: 9.6 mg/dL (ref 8.6–10.2)
CHLORIDE: 98 mmol/L (ref 96–106)
CO2: 27 mmol/L (ref 18–29)
Creatinine, Ser: 0.98 mg/dL (ref 0.76–1.27)
GFR calc Af Amer: 91 mL/min/{1.73_m2} (ref >59)
GFR, EST NON AFRICAN AMERICAN: 79 mL/min/{1.73_m2} (ref >59)
GLUCOSE: 84 mg/dL (ref 65–99)
Globulin, Total: 2 g/dL (ref 1.5–4.5)
POTASSIUM: 4.7 mmol/L (ref 3.5–5.2)
Sodium: 140 mmol/L (ref 134–144)
TOTAL PROTEIN: 6.2 g/dL (ref 6.0–8.5)

## 2016-05-22 ENCOUNTER — Ambulatory Visit: Payer: PPO | Admitting: Family Medicine

## 2016-05-29 ENCOUNTER — Encounter: Payer: Self-pay | Admitting: Family Medicine

## 2016-05-29 ENCOUNTER — Ambulatory Visit (INDEPENDENT_AMBULATORY_CARE_PROVIDER_SITE_OTHER): Payer: PPO | Admitting: Family Medicine

## 2016-05-29 VITALS — BP 96/64 | HR 64 | Temp 99.0°F | Resp 16 | Ht 67.0 in | Wt 134.4 lb

## 2016-05-29 DIAGNOSIS — E119 Type 2 diabetes mellitus without complications: Secondary | ICD-10-CM

## 2016-05-29 DIAGNOSIS — E785 Hyperlipidemia, unspecified: Secondary | ICD-10-CM | POA: Diagnosis not present

## 2016-05-29 DIAGNOSIS — Z794 Long term (current) use of insulin: Secondary | ICD-10-CM | POA: Diagnosis not present

## 2016-05-29 DIAGNOSIS — I10 Essential (primary) hypertension: Secondary | ICD-10-CM | POA: Diagnosis not present

## 2016-05-29 LAB — COMPREHENSIVE METABOLIC PANEL
ALK PHOS: 46 U/L (ref 40–115)
ALT: 37 U/L (ref 9–46)
AST: 36 U/L — AB (ref 10–35)
Albumin: 4.6 g/dL (ref 3.6–5.1)
BUN: 30 mg/dL — AB (ref 7–25)
CO2: 26 mmol/L (ref 20–31)
CREATININE: 1.38 mg/dL — AB (ref 0.70–1.25)
Calcium: 9.7 mg/dL (ref 8.6–10.3)
Chloride: 100 mmol/L (ref 98–110)
Glucose, Bld: 81 mg/dL (ref 65–99)
Potassium: 5.3 mmol/L (ref 3.5–5.3)
SODIUM: 136 mmol/L (ref 135–146)
TOTAL PROTEIN: 7.1 g/dL (ref 6.1–8.1)
Total Bilirubin: 0.5 mg/dL (ref 0.2–1.2)

## 2016-05-29 LAB — LIPID PANEL
Cholesterol: 133 mg/dL (ref 125–200)
HDL: 85 mg/dL (ref >=40)
LDL CALC: 40 mg/dL (ref <130)
Total CHOL/HDL Ratio: 1.6 Ratio (ref <=5.0)
Triglycerides: 42 mg/dL (ref <150)
VLDL: 8 mg/dL (ref <30)

## 2016-05-29 LAB — POCT UA - MICROALBUMIN: Microalbumin Ur, POC: NEGATIVE mg/L

## 2016-05-29 LAB — GLUCOSE, POCT (MANUAL RESULT ENTRY): POC Glucose: 108 mg/dl — AB (ref 70–99)

## 2016-05-29 LAB — POCT GLYCOSYLATED HEMOGLOBIN (HGB A1C): Hemoglobin A1C: 6

## 2016-05-29 MED ORDER — METFORMIN HCL 1000 MG PO TABS: 1000.0000 mg | ORAL_TABLET | Freq: Two times a day (BID) | ORAL | 0 refills | Status: DC

## 2016-05-29 MED ORDER — SIMVASTATIN 20 MG PO TABS: 20.0000 mg | ORAL_TABLET | Freq: Every day | ORAL | 1 refills | Status: DC

## 2016-05-29 MED ORDER — BD ULTRA-FINE LANCETS MISC: 12 refills | Status: AC

## 2016-05-29 MED ORDER — LISINOPRIL 10 MG PO TABS: 10.0000 mg | ORAL_TABLET | Freq: Every day | ORAL | 0 refills | Status: DC

## 2016-05-29 NOTE — Progress Notes (Signed)
Name: Larry GauzeKenneth S Robinson   MRN: [id number]    DOB: Mar 21, 1946   Date:05/29/2016       Progress Note  Subjective  Chief Complaint  Chief Complaint  Patient presents with  . Diabetes    Diabetes  He presents for his follow-up diabetic visit. He has type 2 diabetes mellitus. His disease course has been stable. Pertinent negatives for hypoglycemia include no headaches. Associated symptoms include polyuria. Pertinent negatives for diabetes include no blurred vision, no chest pain, no fatigue and no polydipsia. Pertinent negatives for diabetic complications include no CVA. Current diabetic treatment includes intensive insulin program and oral agent (monotherapy). He is following a diabetic diet. He has had a previous visit with a dietitian. He rarely participates in exercise. His breakfast blood glucose range is generally 110-130 mg/dl. An ACE inhibitor/angiotensin II receptor blocker is being taken.  Hypertension  This is a chronic problem. The problem is unchanged. The problem is controlled. Pertinent negatives include no blurred vision, chest pain, headaches, palpitations or shortness of breath. Past treatments include ACE inhibitors. There is no history of kidney disease, CAD/MI or CVA.  Hyperlipidemia  This is a chronic problem. The problem is controlled. Recent lipid tests were reviewed and are normal. Exacerbating diseases include diabetes. Pertinent negatives include no chest pain, myalgias or shortness of breath. Current antihyperlipidemic treatment includes statins.    Past Medical History:  Diagnosis Date  . Hypertension     Past Surgical History:  Procedure Laterality Date  . FOOT SURGERY Right     Family History  Problem Relation Age of Onset  . Hypertension Mother   . Diabetes Father     Social History   Social History  . Marital status: Single    Spouse name: N/A  . Number of children: N/A  . Years of education: N/A   Occupational History  . Not on file.   Social  History Main Topics  . Smoking status: Current Every Day Smoker    Packs/day: 10.00    Types: Cigarettes  . Smokeless tobacco: Never Used  . Alcohol use 0.0 oz/week     Comment: occasional  . Drug use: No  . Sexual activity: Not on file   Other Topics Concern  . Not on file   Social History Narrative  . No narrative on file     Current Outpatient Prescriptions:  .  insulin aspart (NOVOLOG) 100 UNIT/ML FlexPen, Inject 4 Units into the skin 2 (two) times daily with a meal., Disp: 15 mL, Rfl: 2 .  Insulin Glargine (LANTUS SOLOSTAR) 100 UNIT/ML Solostar Pen, Inject 10 Units into the skin daily at 10 pm., Disp: 3 pen, Rfl: 2 .  lisinopril (PRINIVIL,ZESTRIL) 10 MG tablet, Take 1 tablet (10 mg total) by mouth daily., Disp: 90 tablet, Rfl: 0 .  metFORMIN (GLUCOPHAGE) 1000 MG tablet, Take 1 tablet (1,000 mg total) by mouth 2 (two) times daily with a meal., Disp: 180 tablet, Rfl: 0 .  simvastatin (ZOCOR) 20 MG tablet, Take 1 tablet by mouth at bedtime., Disp: , Rfl:   No Known Allergies   Review of Systems  Constitutional: Negative for fatigue.  Eyes: Negative for blurred vision.  Respiratory: Negative for shortness of breath.   Cardiovascular: Negative for chest pain and palpitations.  Musculoskeletal: Negative for myalgias.  Neurological: Negative for headaches.  Endo/Heme/Allergies: Negative for polydipsia.    Objective  Vitals:   05/29/16 1012  BP: 96/64  Pulse: 64  Resp: 16  Temp: 99 F (37.2 C)  SpO2: 98%  Weight: 134 lb 6 oz (61 kg)  Height: 5\' 7"  (1.702 m)    Physical Exam  Constitutional: He is oriented to person, place, and time and well-developed, well-nourished, and in no distress.  HENT:  Head: Normocephalic and atraumatic.  Cardiovascular: Normal rate, regular rhythm, S1 normal and S2 normal.   No murmur heard. Pulmonary/Chest: Effort normal and breath sounds normal. He has no wheezes. He has no rhonchi.  Abdominal: Soft. Bowel sounds are normal. There  is no tenderness.  Musculoskeletal:       Right ankle: He exhibits no swelling.       Left ankle: He exhibits no swelling.  Neurological: He is alert and oriented to person, place, and time.  Psychiatric: Mood, memory, affect and judgment normal.  Nursing note and vitals reviewed.    Assessment & Plan  1. Controlled type 2 diabetes mellitus without complication, with long-term current use of insulin (HCC) Hemoglobin A1c at goal at 6.0% urine microalbumin is negative. Continue on present therapy, - metFORMIN (GLUCOPHAGE) 1000 MG tablet; Take 1 tablet (1,000 mg total) by mouth 2 (two) times daily with a meal.  Dispense: 180 tablet; Refill: 0 - BD ULTRA-FINE LANCETS lancets; Use as instructed to check blood glucose three times daily.  Dispense: 100 each; Refill: 12 - POCT Glucose (CBG) - POCT HgB A1C - POCT UA - Microalbumin  2. Dyslipidemia  - Lipid Profile - Comprehensive Metabolic Panel (CMET) - simvastatin (ZOCOR) 20 MG tablet; Take 1 tablet (20 mg total) by mouth at bedtime.  Dispense: 90 tablet; Refill: 1  3. Essential hypertension BP stable and controlled on present therapy - lisinopril (PRINIVIL,ZESTRIL) 10 MG tablet; Take 1 tablet (10 mg total) by mouth daily.  Dispense: 90 tablet; Refill: 0     Hernando Reali [facility] 05/29/2016 10:35 AM

## 2016-06-12 DIAGNOSIS — E113293 Type 2 diabetes mellitus with mild nonproliferative diabetic retinopathy without macular edema, bilateral: Secondary | ICD-10-CM | POA: Diagnosis not present

## 2016-06-12 DIAGNOSIS — H25019 Cortical age-related cataract, unspecified eye: Secondary | ICD-10-CM | POA: Diagnosis not present

## 2016-06-12 DIAGNOSIS — H25043 Posterior subcapsular polar age-related cataract, bilateral: Secondary | ICD-10-CM | POA: Diagnosis not present

## 2016-06-28 ENCOUNTER — Other Ambulatory Visit: Payer: Self-pay | Admitting: Family Medicine

## 2016-06-28 DIAGNOSIS — Z794 Long term (current) use of insulin: Principal | ICD-10-CM

## 2016-06-28 DIAGNOSIS — E119 Type 2 diabetes mellitus without complications: Secondary | ICD-10-CM

## 2016-07-01 ENCOUNTER — Telehealth: Payer: Self-pay | Admitting: Family Medicine

## 2016-07-01 NOTE — Telephone Encounter (Signed)
Requesting refill on Lantus he is completely out. Please send to [facility]

## 2016-07-01 NOTE — Telephone Encounter (Signed)
Prescription for Lantus is sent to pharmacy

## 2016-07-01 NOTE — Telephone Encounter (Signed)
Patient informed that prescription has been sent to pharmacy 

## 2016-07-18 ENCOUNTER — Encounter: Payer: Self-pay | Admitting: Family Medicine

## 2016-08-19 ENCOUNTER — Telehealth: Payer: Self-pay | Admitting: Family Medicine

## 2016-08-19 NOTE — Telephone Encounter (Signed)
Called to schedule AWV no answer - knb

## 2016-08-28 ENCOUNTER — Ambulatory Visit (INDEPENDENT_AMBULATORY_CARE_PROVIDER_SITE_OTHER): Payer: PPO | Admitting: Family Medicine

## 2016-08-28 ENCOUNTER — Encounter: Payer: Self-pay | Admitting: Family Medicine

## 2016-08-28 VITALS — BP 100/60 | HR 91 | Temp 97.9°F | Resp 16 | Ht 67.0 in | Wt 139.8 lb

## 2016-08-28 DIAGNOSIS — E785 Hyperlipidemia, unspecified: Secondary | ICD-10-CM | POA: Diagnosis not present

## 2016-08-28 DIAGNOSIS — E119 Type 2 diabetes mellitus without complications: Secondary | ICD-10-CM

## 2016-08-28 DIAGNOSIS — Z794 Long term (current) use of insulin: Secondary | ICD-10-CM | POA: Diagnosis not present

## 2016-08-28 DIAGNOSIS — I10 Essential (primary) hypertension: Secondary | ICD-10-CM

## 2016-08-28 LAB — GLUCOSE, POCT (MANUAL RESULT ENTRY): POC GLUCOSE: 132 mg/dL — AB (ref 70–99)

## 2016-08-28 LAB — POCT GLYCOSYLATED HEMOGLOBIN (HGB A1C): Hemoglobin A1C: 6.4

## 2016-08-28 MED ORDER — LISINOPRIL 10 MG PO TABS: 10.0000 mg | ORAL_TABLET | Freq: Every day | ORAL | 0 refills | Status: DC

## 2016-08-28 NOTE — Progress Notes (Signed)
Name: Larry GauzeKenneth S Spychalski   MRN: [id number]    DOB: [birth year]   Date:08/28/2016       Progress Note  Subjective  Chief Complaint  Chief Complaint  Patient presents with  . Diabetes    3 month recheck  . Hyperlipidemia    Diabetes  He presents for his follow-up diabetic visit. He has type 2 diabetes mellitus. His disease course has been stable. Pertinent negatives for hypoglycemia include no headaches or sweats. Associated symptoms include polyuria. Pertinent negatives for diabetes include no blurred vision, no chest pain, no fatigue and no polydipsia. Pertinent negatives for diabetic complications include no CVA, heart disease, impotence or peripheral neuropathy. Current diabetic treatment includes intensive insulin program and oral agent (monotherapy). He is following a diabetic diet. He participates in exercise three times a week. His breakfast blood glucose range is generally 140-180 mg/dl. An ACE inhibitor/angiotensin II receptor blocker is being taken.  Hyperlipidemia  This is a chronic problem. Recent lipid tests were reviewed and are normal. Pertinent negatives include no chest pain, myalgias or shortness of breath. Current antihyperlipidemic treatment includes statins. Risk factors for coronary artery disease include diabetes mellitus, dyslipidemia and male sex.  Hypertension  This is a chronic problem. The problem is unchanged. The problem is controlled. Pertinent negatives include no blurred vision, chest pain, headaches, palpitations, shortness of breath or sweats. Past treatments include ACE inhibitors. There is no history of kidney disease, CAD/MI or CVA.     Past Medical History:  Diagnosis Date  . Hypertension     Past Surgical History:  Procedure Laterality Date  . FOOT SURGERY Right     Family History  Problem Relation Age of Onset  . Hypertension Mother   . Diabetes Father     Social History   Social History  . Marital status: Single    Spouse name: N/A  .  Number of children: N/A  . Years of education: N/A   Occupational History  . Not on file.   Social History Main Topics  . Smoking status: Current Every Day Smoker    Packs/day: 10.00    Types: Cigarettes  . Smokeless tobacco: Never Used  . Alcohol use 0.0 oz/week     Comment: occasional  . Drug use: No  . Sexual activity: Not on file   Other Topics Concern  . Not on file   Social History Narrative  . No narrative on file     Current Outpatient Prescriptions:  .  BD ULTRA-FINE LANCETS lancets, Use as instructed to check blood glucose three times daily., Disp: 100 each, Rfl: 12 .  insulin aspart (NOVOLOG) 100 UNIT/ML FlexPen, Inject 4 Units into the skin 2 (two) times daily with a meal., Disp: 15 mL, Rfl: 2 .  LANTUS SOLOSTAR 100 UNIT/ML Solostar Pen, inject 10 units subcutaneously once daily AT 10PM, Disp: 5 pen, Rfl: 2 .  lisinopril (PRINIVIL,ZESTRIL) 10 MG tablet, Take 1 tablet (10 mg total) by mouth daily., Disp: 90 tablet, Rfl: 0 .  metFORMIN (GLUCOPHAGE) 1000 MG tablet, Take 1 tablet (1,000 mg total) by mouth 2 (two) times daily with a meal., Disp: 180 tablet, Rfl: 0 .  simvastatin (ZOCOR) 20 MG tablet, Take 1 tablet (20 mg total) by mouth at bedtime., Disp: 90 tablet, Rfl: 1 .  B-D ULTRAFINE III SHORT PEN 31G X 8 MM MISC, TEST BLOOD SUGAR three times a day, Disp: , Rfl: 1  No Known Allergies   Review of Systems  Constitutional: Negative for fatigue.  Eyes: Negative for blurred vision.  Respiratory: Negative for shortness of breath.   Cardiovascular: Negative for chest pain and palpitations.  Genitourinary: Negative for impotence.  Musculoskeletal: Negative for myalgias.  Neurological: Negative for headaches.  Endo/Heme/Allergies: Negative for polydipsia.      Objective  Vitals:   08/28/16 0948  BP: 100/60  Pulse: 91  Resp: 16  Temp: 97.9 F (36.6 C)  TempSrc: Oral  SpO2: 92%  Weight: 139 lb 12.8 oz (63.4 kg)  Height: 5\' 7"  (1.702 m)    Physical  Exam  Constitutional: He is oriented to person, place, and time and well-developed, well-nourished, and in no distress.  HENT:  Head: Normocephalic and atraumatic.  Cardiovascular: Normal rate, regular rhythm, S1 normal and S2 normal.   No murmur heard. Pulmonary/Chest: Effort normal and breath sounds normal. He has no wheezes. He has no rhonchi.  Abdominal: Soft. Bowel sounds are normal. There is no tenderness.  Musculoskeletal:       Right ankle: He exhibits no swelling.       Left ankle: He exhibits no swelling.  Neurological: He is alert and oriented to person, place, and time.  Psychiatric: Mood, memory, affect and judgment normal.  Nursing note and vitals reviewed.   Recent Results (from the past 2160 hour(s))  POCT Glucose (CBG)     Status: Abnormal   Collection Time: 08/28/16  9:52 AM  Result Value Ref Range   POC Glucose 132 (A) 70 - 99 mg/dl  POCT HgB Q2VA1C     Status: None   Collection Time: 08/28/16  9:53 AM  Result Value Ref Range   Hemoglobin A1C 6.4      Assessment & Plan  1. Essential hypertension BP stable and controlled on present antihypertensive therapy - lisinopril (PRINIVIL,ZESTRIL) 10 MG tablet; Take 1 tablet (10 mg total) by mouth daily.  Dispense: 90 tablet; Refill: 0  2. Dyslipidemia FLP at goal from September 2017, continue on statin  3. Type 2 diabetes mellitus without complication, with long-term current use of insulin (HCC) A1c 6.4%, well-controlled diabetes. No change in pharmacotherapy - POCT HgB A1C - POCT Glucose (CBG)   Danyon Mcginness Asad A. Faylene KurtzShah [facility] 08/28/2016 10:04 AM

## 2016-09-11 ENCOUNTER — Telehealth: Payer: Self-pay | Admitting: Family Medicine

## 2016-09-11 NOTE — Telephone Encounter (Signed)
Patient requesting refill on metformin 1000. Please send to [facility]

## 2016-09-13 ENCOUNTER — Other Ambulatory Visit: Payer: Self-pay | Admitting: Family Medicine

## 2016-09-13 ENCOUNTER — Other Ambulatory Visit: Payer: Self-pay | Admitting: Emergency Medicine

## 2016-09-13 DIAGNOSIS — E119 Type 2 diabetes mellitus without complications: Secondary | ICD-10-CM

## 2016-09-13 DIAGNOSIS — Z794 Long term (current) use of insulin: Principal | ICD-10-CM

## 2016-09-13 MED ORDER — METFORMIN HCL 1000 MG PO TABS: 1000.0000 mg | ORAL_TABLET | Freq: Two times a day (BID) | ORAL | 0 refills | Status: DC

## 2016-09-30 ENCOUNTER — Encounter: Payer: Self-pay | Admitting: Family Medicine

## 2016-09-30 ENCOUNTER — Ambulatory Visit (INDEPENDENT_AMBULATORY_CARE_PROVIDER_SITE_OTHER): Payer: PPO | Admitting: Family Medicine

## 2016-09-30 VITALS — BP 104/56 | HR 67 | Temp 99.2°F | Resp 16 | Ht 67.0 in | Wt 148.0 lb

## 2016-09-30 DIAGNOSIS — Z0000 Encounter for general adult medical examination without abnormal findings: Secondary | ICD-10-CM | POA: Diagnosis not present

## 2016-09-30 DIAGNOSIS — N429 Disorder of prostate, unspecified: Secondary | ICD-10-CM | POA: Diagnosis not present

## 2016-09-30 DIAGNOSIS — I10 Essential (primary) hypertension: Secondary | ICD-10-CM | POA: Diagnosis not present

## 2016-09-30 DIAGNOSIS — E559 Vitamin D deficiency, unspecified: Secondary | ICD-10-CM | POA: Diagnosis not present

## 2016-09-30 LAB — CBC WITH DIFFERENTIAL/PLATELET
BASOS PCT: 0 %
Basophils Absolute: 0 cells/uL (ref 0–200)
EOS ABS: 354 {cells}/uL (ref 15–500)
Eosinophils Relative: 6 %
HCT: 46.9 % (ref 38.5–50.0)
Hemoglobin: 15.8 g/dL (ref 13.2–17.1)
LYMPHS PCT: 29 %
Lymphs Abs: 1711 cells/uL (ref 850–3900)
MCH: 31.7 pg (ref 27.0–33.0)
MCHC: 33.7 g/dL (ref 32.0–36.0)
MCV: 94.2 fL (ref 80.0–100.0)
MONOS PCT: 6 %
MPV: 11.3 fL (ref 7.5–12.5)
Monocytes Absolute: 354 cells/uL (ref 200–950)
Neutro Abs: 3481 cells/uL (ref 1500–7800)
Neutrophils Relative %: 59 %
PLATELETS: 128 10*3/uL — AB (ref 140–400)
RBC: 4.98 MIL/uL (ref 4.20–5.80)
RDW: 13.8 % (ref 11.0–15.0)
WBC: 5.9 10*3/uL (ref 3.8–10.8)

## 2016-09-30 LAB — PSA: PSA: 1.2 ng/mL (ref <=4.0)

## 2016-09-30 LAB — TSH: TSH: 1.63 mIU/L (ref 0.40–4.50)

## 2016-09-30 NOTE — Progress Notes (Signed)
Name: Larry GauzeKenneth S Gironda   MRN: [id number]    DOB: November 30, 1945   Date:09/30/2016       Progress Note  Subjective  Chief Complaint  Chief Complaint  Patient presents with  . Annual Exam    HPI  Pt. Presents for complete physical exam. He is doing well, no complaints.   Past Medical History:  Diagnosis Date  . Hypertension     Past Surgical History:  Procedure Laterality Date  . FOOT SURGERY Right     Family History  Problem Relation Age of Onset  . Hypertension Mother   . Diabetes Father     Social History   Social History  . Marital status: Single    Spouse name: N/A  . Number of children: N/A  . Years of education: N/A   Occupational History  . Not on file.   Social History Main Topics  . Smoking status: Current Every Day Smoker    Packs/day: 10.00    Types: Cigarettes  . Smokeless tobacco: Never Used  . Alcohol use 0.0 oz/week     Comment: occasional  . Drug use: No  . Sexual activity: Not on file   Other Topics Concern  . Not on file   Social History Narrative  . No narrative on file     Current Outpatient Prescriptions:  .  B-D ULTRAFINE III SHORT PEN 31G X 8 MM MISC, TEST BLOOD SUGAR three times a day, Disp: , Rfl: 1 .  BD ULTRA-FINE LANCETS lancets, Use as instructed to check blood glucose three times daily., Disp: 100 each, Rfl: 12 .  insulin aspart (NOVOLOG) 100 UNIT/ML FlexPen, Inject 4 Units into the skin 2 (two) times daily with a meal., Disp: 15 mL, Rfl: 2 .  LANTUS SOLOSTAR 100 UNIT/ML Solostar Pen, inject 10 units subcutaneously once daily AT 10PM, Disp: 5 pen, Rfl: 2 .  lisinopril (PRINIVIL,ZESTRIL) 10 MG tablet, Take 1 tablet (10 mg total) by mouth daily., Disp: 90 tablet, Rfl: 0 .  metFORMIN (GLUCOPHAGE) 1000 MG tablet, Take 1 tablet (1,000 mg total) by mouth 2 (two) times daily with a meal., Disp: 180 tablet, Rfl: 0 .  simvastatin (ZOCOR) 20 MG tablet, Take 1 tablet (20 mg total) by mouth at bedtime., Disp: 90 tablet, Rfl: 1  No  Known Allergies   Review of Systems  Constitutional: Negative for chills, fever and malaise/fatigue.  HENT: Negative for congestion and sore throat.   Eyes: Negative for blurred vision and double vision.  Respiratory: Positive for cough. Negative for shortness of breath and wheezing.   Cardiovascular: Negative for chest pain and leg swelling.  Gastrointestinal: Negative for abdominal pain, blood in stool, constipation and diarrhea.  Genitourinary: Negative for hematuria and urgency.  Musculoskeletal: Negative for back pain, joint pain and neck pain.  Psychiatric/Behavioral: Negative for depression. The patient is not nervous/anxious and does not have insomnia.       Objective  Vitals:   09/30/16 1421  BP: (!) 104/56  Pulse: 67  Resp: 16  Temp: 99.2 F (37.3 C)  TempSrc: Oral  SpO2: 97%  Weight: 148 lb (67.1 kg)  Height: 5\' 7"  (1.702 m)    Physical Exam  Constitutional: He is oriented to person, place, and time and well-developed, well-nourished, and in no distress.  HENT:  Head: Normocephalic and atraumatic.  Eyes: Pupils are equal, round, and reactive to light.  Cardiovascular: Normal rate, regular rhythm and normal heart sounds.   No murmur heard. Pulmonary/Chest: Effort normal and breath  sounds normal. He has no wheezes.  Abdominal: Soft. Bowel sounds are normal. There is no tenderness.  Genitourinary: Rectum normal and prostate normal. Prostate is not tender.  Musculoskeletal:       Right ankle: He exhibits no swelling.       Left ankle: He exhibits no swelling.  Neurological: He is alert and oriented to person, place, and time.  Skin: Skin is warm, dry and intact.  Psychiatric: Mood, memory, affect and judgment normal.  Nursing note and vitals reviewed.      Assessment & Plan  1. Annual physical exam Twelve-lead EKG is normal sinus rhythm, obtained pertinent screening lab work. Referral to gastroenterology for colonoscopy - EKG 12-Lead - CBC with  Differential - TSH - Vitamin D (25 hydroxy) - PSA - Ambulatory referral to Gastroenterology   Kathi Ludwig Asad A. Faylene Kurt[facility] 09/30/2016 2:36 PM

## 2016-10-01 LAB — VITAMIN D 25 HYDROXY (VIT D DEFICIENCY, FRACTURES): Vit D, 25-Hydroxy: 70 ng/mL (ref 30–100)

## 2016-10-16 ENCOUNTER — Telehealth: Payer: Self-pay

## 2016-10-16 ENCOUNTER — Other Ambulatory Visit: Payer: Self-pay

## 2016-10-16 NOTE — Telephone Encounter (Signed)
Gastroenterology Pre-Procedure Review  Request Date:  Requesting Physician: Dr.   PATIENT REVIEW QUESTIONS: The patient responded to the following health history questions as indicated:    1. Are you having any GI issues? no 2. Do you have a personal history of Polyps? no 3. Do you have a family history of Colon Cancer or Polyps? no 4. Diabetes Mellitus? yes (Type 2) 5. Joint replacements in the past 12 months?no 6. Major health problems in the past 3 months?no 7. Any artificial heart valves, MVP, or defibrillator?no    MEDICATIONS & ALLERGIES:    Patient reports the following regarding taking any anticoagulation/antiplatelet therapy:   Plavix, Coumadin, Eliquis, Xarelto, Lovenox, Pradaxa, Brilinta, or Effient? no Aspirin? no  Patient confirms/reports the following medications:  Current Outpatient Prescriptions  Medication Sig Dispense Refill  . B-D ULTRAFINE III SHORT PEN 31G X 8 MM MISC TEST BLOOD SUGAR three times a day  1  . BD ULTRA-FINE LANCETS lancets Use as instructed to check blood glucose three times daily. 100 each 12  . insulin aspart (NOVOLOG) 100 UNIT/ML FlexPen Inject 4 Units into the skin 2 (two) times daily with a meal. 15 mL 2  . LANTUS SOLOSTAR 100 UNIT/ML Solostar Pen inject 10 units subcutaneously once daily AT 10PM 5 pen 2  . lisinopril (PRINIVIL,ZESTRIL) 10 MG tablet Take 1 tablet (10 mg total) by mouth daily. 90 tablet 0  . metFORMIN (GLUCOPHAGE) 1000 MG tablet Take 1 tablet (1,000 mg total) by mouth 2 (two) times daily with a meal. 180 tablet 0  . simvastatin (ZOCOR) 20 MG tablet Take 1 tablet (20 mg total) by mouth at bedtime. 90 tablet 1   No current facility-administered medications for this visit.     Patient confirms/reports the following allergies:  No Known Allergies  No orders of the defined types were placed in this encounter.   AUTHORIZATION INFORMATION Primary Insurance: 1D#: Group #:  Secondary Insurance: 1D#: Group #:  SCHEDULE  INFORMATION: Date: 10/25/16 Time: Location: [facility]

## 2016-10-23 ENCOUNTER — Telehealth: Payer: Self-pay

## 2016-10-23 NOTE — Telephone Encounter (Signed)
Tried contacting pt but no vm available to leave message. 

## 2016-10-23 NOTE — Telephone Encounter (Signed)
Patient wants to reschedule his colonoscopy. Please call patient and advice.

## 2016-10-24 NOTE — Telephone Encounter (Signed)
Tried contacting pt again to reschedule. No vm to leave message. Left message with sister in law (emergency contact number) to have pt call me back.

## 2016-10-25 ENCOUNTER — Encounter: Admission: RE | Payer: Self-pay | Source: Ambulatory Visit

## 2016-10-25 ENCOUNTER — Ambulatory Visit: Admission: RE | Admit: 2016-10-25 | Payer: PPO | Source: Ambulatory Visit | Admitting: Gastroenterology

## 2016-10-25 ENCOUNTER — Other Ambulatory Visit: Payer: Self-pay

## 2016-10-25 SURGERY — COLONOSCOPY WITH PROPOFOL
Anesthesia: General

## 2016-10-25 NOTE — Telephone Encounter (Signed)
Patient left a voice message that he would like to reschedule his colonoscopy °

## 2016-10-25 NOTE — Telephone Encounter (Signed)
Pt [facility] with Tobi BastosAnna for screening colonoscopy.

## 2016-11-06 ENCOUNTER — Encounter: Payer: Self-pay | Admitting: *Deleted

## 2016-11-07 ENCOUNTER — Ambulatory Visit: Payer: PPO | Admitting: Anesthesiology

## 2016-11-07 ENCOUNTER — Encounter
Admission: RE | Disposition: A | Discharge status: Home / Self Care | Payer: Self-pay | Source: Ambulatory Visit | Attending: Gastroenterology

## 2016-11-07 ENCOUNTER — Ambulatory Visit
Admission: RE | Admit: 2016-11-07 | Discharge: 2016-11-07 | Disposition: A | Discharge status: Home / Self Care | Payer: PPO | Source: Ambulatory Visit | Attending: Gastroenterology | Admitting: Gastroenterology

## 2016-11-07 ENCOUNTER — Encounter: Payer: Self-pay | Admitting: *Deleted

## 2016-11-07 DIAGNOSIS — F17210 Nicotine dependence, cigarettes, uncomplicated: Secondary | ICD-10-CM | POA: Diagnosis not present

## 2016-11-07 DIAGNOSIS — Z1211 Encounter for screening for malignant neoplasm of colon: Secondary | ICD-10-CM | POA: Diagnosis not present

## 2016-11-07 DIAGNOSIS — Z794 Long term (current) use of insulin: Secondary | ICD-10-CM | POA: Insufficient documentation

## 2016-11-07 DIAGNOSIS — Z79899 Other long term (current) drug therapy: Secondary | ICD-10-CM | POA: Insufficient documentation

## 2016-11-07 DIAGNOSIS — I10 Essential (primary) hypertension: Secondary | ICD-10-CM | POA: Diagnosis not present

## 2016-11-07 DIAGNOSIS — E119 Type 2 diabetes mellitus without complications: Secondary | ICD-10-CM | POA: Insufficient documentation

## 2016-11-07 HISTORY — PX: COLONOSCOPY WITH PROPOFOL: SHX5780

## 2016-11-07 LAB — GLUCOSE, CAPILLARY
GLUCOSE-CAPILLARY: 109 mg/dL — AB (ref 65–99)
GLUCOSE-CAPILLARY: 50 mg/dL — AB (ref 65–99)
Glucose-Capillary: 51 mg/dL — ABNORMAL LOW (ref 65–99)

## 2016-11-07 SURGERY — COLONOSCOPY WITH PROPOFOL
Anesthesia: General

## 2016-11-07 MED ORDER — LIDOCAINE HCL (PF) 1 % IJ SOLN
2.0000 mL | Freq: Once | INTRAMUSCULAR | Status: AC
  Administered 2016-11-07: 0.3 mL via INTRADERMAL

## 2016-11-07 MED ORDER — SODIUM CHLORIDE 0.9 % IV SOLN
INTRAVENOUS | Status: DC | PRN
  Administered 2016-11-07: 08:00:00 via INTRAVENOUS

## 2016-11-07 MED ORDER — SODIUM CHLORIDE 0.9 % IV SOLN
INTRAVENOUS | Status: DC
  Administered 2016-11-07: 1000 mL via INTRAVENOUS

## 2016-11-07 MED ORDER — PROPOFOL 10 MG/ML IV BOLUS
INTRAVENOUS | Status: DC | PRN
  Administered 2016-11-07: 50 mg via INTRAVENOUS

## 2016-11-07 MED ORDER — LIDOCAINE HCL (PF) 2 % IJ SOLN
INTRAMUSCULAR | Status: DC | PRN
  Administered 2016-11-07: 50 mg via INTRADERMAL

## 2016-11-07 MED ORDER — LIDOCAINE HCL (PF) 1 % IJ SOLN
INTRAMUSCULAR | Status: AC
  Administered 2016-11-07: 0.3 mL via INTRADERMAL
  Filled 2016-11-07: qty 2

## 2016-11-07 MED ORDER — PROPOFOL 500 MG/50ML IV EMUL
INTRAVENOUS | Status: DC | PRN
  Administered 2016-11-07: 120 ug/kg/min via INTRAVENOUS

## 2016-11-07 NOTE — Transfer of Care (Signed)
Immediate Anesthesia Transfer of Care Note  Patient: Larry MiniumKenneth S Robinson  Procedure(s) Performed: Procedure(s): COLONOSCOPY WITH PROPOFOL (N/A)  Patient Location: Endoscopy Unit  Anesthesia Type:General  Level of Consciousness: awake and alert   Airway & Oxygen Therapy: Patient Spontanous Breathing and Patient connected to nasal cannula oxygen  Post-op Assessment: Report given to RN and Post -op Vital signs reviewed and stable  Post vital signs: Reviewed and stable  Last Vitals:  Vitals:   11/07/16 0808 11/07/16 0905  BP: 121/68 102/66  Pulse: 65 60  Resp: 17 12  Temp: (!) 35.8 C 36.3 C    Last Pain:  Vitals:   11/07/16 0905  TempSrc: Tympanic         Complications: No apparent anesthesia complications

## 2016-11-07 NOTE — Anesthesia Post-op Follow-up Note (Signed)
Anesthesia QCDR form completed.        

## 2016-11-07 NOTE — Op Note (Signed)
[facility] Gastroenterology Patient Name: Larry BruceKenneth Muhs Procedure Date: 11/07/2016 8:53 AM MRN: [id number] Account #: [id number] Date of Birth: 1946-07-18 Admit Type: Outpatient Age: 71 [facility] ENDO ROOM 4 Gender: Male Note Status: Finalized Procedure:            Colonoscopy Indications:          Screening for colorectal malignant neoplasm Providers:            Wyline MoodKiran Waylon Hershey MD, MD Referring MD:         No Local Md, MD (Referring MD) Medicines:            Monitored Anesthesia Care Complications:        No immediate complications. Procedure:            Pre-Anesthesia Assessment:                       - Prior to the procedure, a History and Physical was                        performed, and patient medications, allergies and                        sensitivities were reviewed. The patient's tolerance of                        previous anesthesia was reviewed.                       - The risks and benefits of the procedure and the                        sedation options and risks were discussed with the                        patient. All questions were answered and informed                        consent was obtained.                       - The risks and benefits of the procedure and the                        sedation options and risks were discussed with the                        patient. All questions were answered and informed                        consent was obtained.                       - ASA Grade Assessment: III - A patient with severe                        systemic disease.                       After obtaining informed consent, the colonoscope was  passed under direct vision. Throughout the procedure,                        the patient's blood pressure, pulse, and oxygen                        saturations were monitored continuously. The                        Colonoscope was introduced through the anus and         advanced to the the descending colon. The colonoscopy                        was performed with ease. The patient tolerated the                        procedure well. The quality of the bowel preparation                        was inadequate. Findings:      Copious quantities of solid stool was found in the descending colon,       precluding visualization. Impression:           - Preparation of the colon was inadequate.                       - Stool in the descending colon.                       - No specimens collected. Recommendation:       - Discharge patient to home (with escort).                       - Resume previous diet.                       - Continue present medications.                       - Repeat colonoscopy in 2 weeks because the bowel                        preparation was suboptimal. Procedure Code(s):    --- Professional ---                       Z6109, 53, Colorectal cancer screening; colonoscopy on                        individual not meeting criteria for high risk Diagnosis Code(s):    --- Professional ---                       Z12.11, Encounter for screening for malignant neoplasm                        of colon CPT copyright 2016 American Medical Association. All rights reserved. The codes documented in this report are preliminary and upon coder review may  be revised to meet current compliance requirements. Wyline Mood, MD Wyline Mood MD, MD 11/07/2016 9:01:56 AM This report has been signed electronically. Number of Addenda: 0 Note Initiated On:  11/07/2016 8:53 AM Total Procedure Duration: 0 hours 2 minutes 3 seconds       [facility]

## 2016-11-07 NOTE — Anesthesia Postprocedure Evaluation (Signed)
Anesthesia Post Note  Patient: Reynaldo MiniumKenneth S Stavola  Procedure(s) Performed: Procedure(s) (LRB): COLONOSCOPY WITH PROPOFOL (N/A)  Patient location during evaluation: Endoscopy Anesthesia Type: General Level of consciousness: awake and alert and oriented Pain management: pain level controlled Vital Signs Assessment: post-procedure vital signs reviewed and stable Respiratory status: spontaneous breathing, nonlabored ventilation and respiratory function stable Cardiovascular status: blood pressure returned to baseline and stable Postop Assessment: no signs of nausea or vomiting Anesthetic complications: no     Last Vitals:  Vitals:   11/07/16 0808 11/07/16 0905  BP: 121/68 102/66  Pulse: 65 60  Resp: 17 12  Temp: (!) 35.8 C 36.3 C    Last Pain:  Vitals:   11/07/16 0905  TempSrc: Tympanic                 Adeli Frost

## 2016-11-07 NOTE — H&P (Signed)
Larry MoodKiran Devra Stare MD [address] [place], [place] [address] Phone: [phone] Fax : [phone]  Primary Care Physician:  Brayton ElSyed Shah, MD Primary Gastroenterologist:  Dr. Wyline MoodKiran Aamilah Robinson   Pre-Procedure History & Physical: HPI:  Larry Robinson is a 71 y.o. male is here for an colonoscopy.   Past Medical History:  Diagnosis Date  . Diabetes mellitus without complication (HCC)   . Hypertension     Past Surgical History:  Procedure Laterality Date  . FOOT SURGERY Right     Prior to Admission medications   Medication Sig Start Date End Date Taking? Authorizing Provider  B-D ULTRAFINE III SHORT PEN 31G X 8 MM MISC TEST BLOOD SUGAR three times a day 06/05/16   Historical Provider, MD  BD ULTRA-FINE LANCETS lancets Use as instructed to check blood glucose three times daily. 05/29/16   Ellyn HackSyed Asad A Shah, MD  insulin aspart (NOVOLOG) 100 UNIT/ML FlexPen Inject 4 Units into the skin 2 (two) times daily with a meal. 02/20/16   Ellyn HackSyed Asad A Shah, MD  LANTUS SOLOSTAR 100 UNIT/ML Solostar Pen inject 10 units subcutaneously once daily AT 10PM 07/01/16   Ellyn HackSyed Asad A Shah, MD  lisinopril (PRINIVIL,ZESTRIL) 10 MG tablet Take 1 tablet (10 mg total) by mouth daily. 08/28/16   Ellyn HackSyed Asad A Shah, MD  metFORMIN (GLUCOPHAGE) 1000 MG tablet Take 1 tablet (1,000 mg total) by mouth 2 (two) times daily with a meal. 09/13/16   Ellyn HackSyed Asad A Shah, MD  simvastatin (ZOCOR) 20 MG tablet Take 1 tablet (20 mg total) by mouth at bedtime. 05/29/16   Ellyn HackSyed Asad A Shah, MD    Allergies as of 10/25/2016  . (No Known Allergies)    Family History  Problem Relation Age of Onset  . Hypertension Mother   . Diabetes Father     Social History   Social History  . Marital status: Single    Spouse name: N/A  . Number of children: N/A  . Years of education: N/A   Occupational History  . Not on file.   Social History Main Topics  . Smoking status: Current Every Day Smoker    Packs/day: 10.00    Types: Cigarettes  .  Smokeless tobacco: Never Used  . Alcohol use 0.0 oz/week     Comment: occasional  . Drug use: No  . Sexual activity: Not on file   Other Topics Concern  . Not on file   Social History Narrative  . No narrative on file    Review of Systems: See HPI, otherwise negative ROS  Physical Exam: BP 121/68   Pulse 65   Temp (!) 96.4 F (35.8 C) (Tympanic)   Resp 17   Ht 5\' 7"  (1.702 m)   Wt 142 lb (64.4 kg)   SpO2 100%   BMI 22.24 kg/m  General:   Alert,  pleasant and cooperative in NAD Head:  Normocephalic and atraumatic. Neck:  Supple; no masses or thyromegaly. Lungs:  Clear throughout to auscultation.    Heart:  Regular rate and rhythm. Abdomen:  Soft, nontender and nondistended. Normal bowel sounds, without guarding, and without rebound.   Neurologic:  Alert and  oriented x4;  grossly normal neurologically.  Impression/Plan: Larry Robinson is here for an colonoscopy to be performed for Screening colonoscopy average risk    Risks, benefits, limitations, and alternatives regarding  colonoscopy have been reviewed with the patient.  Questions have been answered.  All parties agreeable.   Larry MoodKiran Kaedyn Belardo, MD  11/07/2016, 8:46 AM

## 2016-11-07 NOTE — Anesthesia Preprocedure Evaluation (Addendum)
Anesthesia Evaluation  Patient identified by MRN, date of birth, ID band Patient awake    Reviewed: Allergy & Precautions, NPO status , Patient's Chart, lab work & pertinent test results  History of Anesthesia Complications Negative for: history of anesthetic complications  Airway Mallampati: II  TM Distance: >3 FB Neck ROM: Full    Dental  (+) Poor Dentition   Pulmonary neg sleep apnea, neg COPD, Current Smoker,    breath sounds clear to auscultation- rhonchi (-) wheezing      Cardiovascular Exercise Tolerance: Good hypertension, Pt. on medications (-) CAD and (-) Past MI  Rhythm:Regular Rate:Normal - Systolic murmurs and - Diastolic murmurs    Neuro/Psych negative neurological ROS  negative psych ROS   GI/Hepatic negative GI ROS, Neg liver ROS,   Endo/Other  diabetes, Insulin Dependent, Oral Hypoglycemic Agents  Renal/GU negative Renal ROS     Musculoskeletal negative musculoskeletal ROS (+)   Abdominal (+) - obese,   Peds  Hematology negative hematology ROS (+)   Anesthesia Other Findings Past Medical History: No date: Diabetes mellitus without complication (HCC) No date: Hypertension   Reproductive/Obstetrics                            Anesthesia Physical Anesthesia Plan  ASA: III  Anesthesia Plan: General   Post-op Pain Management:    Induction: Intravenous  Airway Management Planned: Natural Airway  Additional Equipment:   Intra-op Plan:   Post-operative Plan:   Informed Consent: I have reviewed the patients History and Physical, chart, labs and discussed the procedure including the risks, benefits and alternatives for the proposed anesthesia with the patient or authorized representative who has indicated his/her understanding and acceptance.   Dental advisory given  Plan Discussed with: CRNA and Anesthesiologist  Anesthesia Plan Comments:          Anesthesia Quick Evaluation

## 2016-11-08 ENCOUNTER — Encounter: Payer: Self-pay | Admitting: Gastroenterology

## 2016-11-20 ENCOUNTER — Telehealth: Payer: Self-pay | Admitting: Gastroenterology

## 2016-11-20 ENCOUNTER — Other Ambulatory Visit: Payer: Self-pay

## 2016-11-20 DIAGNOSIS — Z1211 Encounter for screening for malignant neoplasm of colon: Secondary | ICD-10-CM

## 2016-11-20 NOTE — Telephone Encounter (Signed)
Patient left a voice message that he needs to reschedule his colonoscopy

## 2016-11-20 NOTE — Telephone Encounter (Signed)
Pt [facility] 12[facility]05/16 with Tobi BastosAnna. Screening Z12.11

## 2016-12-07 ENCOUNTER — Other Ambulatory Visit: Payer: Self-pay | Admitting: Family Medicine

## 2016-12-07 DIAGNOSIS — E785 Hyperlipidemia, unspecified: Secondary | ICD-10-CM

## 2016-12-16 ENCOUNTER — Telehealth: Payer: Self-pay | Admitting: Family Medicine

## 2016-12-16 ENCOUNTER — Other Ambulatory Visit: Payer: Self-pay | Admitting: Family Medicine

## 2016-12-16 DIAGNOSIS — Z794 Long term (current) use of insulin: Principal | ICD-10-CM

## 2016-12-16 DIAGNOSIS — E119 Type 2 diabetes mellitus without complications: Secondary | ICD-10-CM

## 2016-12-16 NOTE — Telephone Encounter (Signed)
Pt needs refill on Metformin to be called into [facility] [address]

## 2016-12-17 MED ORDER — METFORMIN HCL 1000 MG PO TABS: 1000.0000 mg | ORAL_TABLET | Freq: Two times a day (BID) | ORAL | 0 refills | Status: DC

## 2016-12-17 NOTE — Telephone Encounter (Signed)
Pt needs refill on Metformin

## 2016-12-17 NOTE — Telephone Encounter (Signed)
Medication has been refilled and sent to [facility] [address] 

## 2016-12-26 ENCOUNTER — Ambulatory Visit
Admission: RE | Admit: 2016-12-26 | Discharge: 2016-12-26 | Disposition: A | Discharge status: Home / Self Care | Payer: PPO | Source: Ambulatory Visit | Attending: Gastroenterology | Admitting: Gastroenterology

## 2016-12-26 ENCOUNTER — Ambulatory Visit: Payer: PPO | Admitting: Anesthesiology

## 2016-12-26 ENCOUNTER — Encounter
Admission: RE | Disposition: A | Discharge status: Home / Self Care | Payer: Self-pay | Source: Ambulatory Visit | Attending: Gastroenterology

## 2016-12-26 ENCOUNTER — Encounter: Payer: Self-pay | Admitting: *Deleted

## 2016-12-26 DIAGNOSIS — I10 Essential (primary) hypertension: Secondary | ICD-10-CM | POA: Diagnosis not present

## 2016-12-26 DIAGNOSIS — K640 First degree hemorrhoids: Secondary | ICD-10-CM | POA: Diagnosis not present

## 2016-12-26 DIAGNOSIS — F17210 Nicotine dependence, cigarettes, uncomplicated: Secondary | ICD-10-CM | POA: Insufficient documentation

## 2016-12-26 DIAGNOSIS — E119 Type 2 diabetes mellitus without complications: Secondary | ICD-10-CM | POA: Diagnosis not present

## 2016-12-26 DIAGNOSIS — Z1211 Encounter for screening for malignant neoplasm of colon: Secondary | ICD-10-CM | POA: Diagnosis not present

## 2016-12-26 DIAGNOSIS — Z794 Long term (current) use of insulin: Secondary | ICD-10-CM | POA: Diagnosis not present

## 2016-12-26 DIAGNOSIS — Z79899 Other long term (current) drug therapy: Secondary | ICD-10-CM | POA: Insufficient documentation

## 2016-12-26 DIAGNOSIS — K649 Unspecified hemorrhoids: Secondary | ICD-10-CM | POA: Diagnosis not present

## 2016-12-26 HISTORY — PX: COLONOSCOPY WITH PROPOFOL: SHX5780

## 2016-12-26 LAB — GLUCOSE, CAPILLARY: Glucose-Capillary: 210 mg/dL — ABNORMAL HIGH (ref 65–99)

## 2016-12-26 SURGERY — COLONOSCOPY WITH PROPOFOL
Anesthesia: General

## 2016-12-26 MED ORDER — PROPOFOL 500 MG/50ML IV EMUL
INTRAVENOUS | Status: AC
  Filled 2016-12-26: qty 50

## 2016-12-26 MED ORDER — PROPOFOL 10 MG/ML IV BOLUS
INTRAVENOUS | Status: DC | PRN
  Administered 2016-12-26: 70 mg via INTRAVENOUS

## 2016-12-26 MED ORDER — PROPOFOL 500 MG/50ML IV EMUL
INTRAVENOUS | Status: DC | PRN
  Administered 2016-12-26: 100 ug/kg/min via INTRAVENOUS

## 2016-12-26 MED ORDER — SODIUM CHLORIDE 0.9 % IV SOLN
INTRAVENOUS | Status: DC
  Administered 2016-12-26: 09:00:00 via INTRAVENOUS

## 2016-12-26 NOTE — Op Note (Signed)
[facility] Gastroenterology Patient Name: Larry Robinson Procedure Date: 12/26/2016 9:28 AM MRN: [id number] Account #: [id number] Date of Birth: 05/08/1946 Admit Type: Outpatient Age: 70 Room: [facility] ENDO ROOM 4 Gender: Male Note Status: Finalized Procedure:            Colonoscopy Indications:          Screening for colorectal malignant neoplasm Providers:            Wyline Mood MD, MD Referring MD:         Shelbie Ammons. Sherryll Burger (Referring MD) Medicines:            Monitored Anesthesia Care Complications:        No immediate complications. Procedure:            Pre-Anesthesia Assessment:                       - Prior to the procedure, a History and Physical was                        performed, and patient medications, allergies and                        sensitivities were reviewed. The patient's tolerance of                        previous anesthesia was reviewed.                       - The risks and benefits of the procedure and the                        sedation options and risks were discussed with the                        patient. All questions were answered and informed                        consent was obtained.                       - ASA Grade Assessment: III - A patient with severe                        systemic disease.                       After obtaining informed consent, the colonoscope was                        passed under direct vision. Throughout the procedure,                        the patient's blood pressure, pulse, and oxygen                        saturations were monitored continuously. The                        Colonoscope was introduced through the anus and  advanced to the the cecum, identified by the                        appendiceal orifice, IC valve and transillumination.                        The colonoscopy was performed with ease. The patient                        tolerated the procedure well. The quality  of the bowel                        preparation was excellent. Findings:      The perianal and digital rectal examinations were normal.      Non-bleeding internal hemorrhoids were found during retroflexion. The       hemorrhoids were medium-sized and Grade I (internal hemorrhoids that do       not prolapse).      The exam was otherwise without abnormality on direct and retroflexion       views. Impression:           - Non-bleeding internal hemorrhoids.                       - The examination was otherwise normal on direct and                        retroflexion views.                       - No specimens collected. Recommendation:       - Discharge patient to home (with escort).                       - Resume previous diet.                       - Continue present medications.                       - Repeat colonoscopy in 10 years for screening purposes. Procedure Code(s):    --- Professional ---                       W0981, Colorectal cancer screening; colonoscopy on                        individual not meeting criteria for high risk Diagnosis Code(s):    --- Professional ---                       K64.0, First degree hemorrhoids                       Z12.11, Encounter for screening for malignant neoplasm                        of colon CPT copyright 2016 American Medical Association. All rights reserved. The codes documented in this report are preliminary and upon coder review may  be revised to meet current compliance requirements. Wyline Mood, MD Wyline Mood MD, MD 12/26/2016 9:59:33 AM This report has been signed electronically. Number of Addenda: 0 Note Initiated  On: 12/26/2016 9:28 AM Scope Withdrawal Time: 0 hours 14 minutes 37 seconds  Total Procedure Duration: 0 hours 25 minutes 3 seconds       [facility]

## 2016-12-26 NOTE — H&P (Signed)
Wyline Mood MD [address] [place], [place] [address] Phone: [phone] Fax : [phone]  Primary Care Physician:  Brayton El, MD Primary Gastroenterologist:  Dr. Wyline Mood   Pre-Procedure History & Physical: HPI:  Larry Robinson is a 71 y.o. male is here for an colonoscopy.   Past Medical History:  Diagnosis Date  . Diabetes mellitus without complication (HCC)   . Hypertension     Past Surgical History:  Procedure Laterality Date  . COLONOSCOPY WITH PROPOFOL N/A 11/07/2016   Procedure: COLONOSCOPY WITH PROPOFOL;  Surgeon: Wyline Mood, MD;  Location: [facility] ENDOSCOPY;  Service: Endoscopy;  Laterality: N/A;  . FOOT SURGERY Right     Prior to Admission medications   Medication Sig Start Date End Date Taking? Authorizing Provider  insulin aspart (NOVOLOG) 100 UNIT/ML FlexPen Inject 4 Units into the skin 2 (two) times daily with a meal. 02/20/16  Yes Ellyn Hack, MD  B-D ULTRAFINE III SHORT PEN 31G X 8 MM MISC TEST BLOOD SUGAR three times a day 06/05/16   Historical Provider, MD  BD ULTRA-FINE LANCETS lancets Use as instructed to check blood glucose three times daily. 05/29/16   Ellyn Hack, MD  LANTUS SOLOSTAR 100 UNIT/ML Solostar Pen inject 10 units subcutaneously once daily AT 10PM 07/01/16   Ellyn Hack, MD  lisinopril (PRINIVIL,ZESTRIL) 10 MG tablet Take 1 tablet (10 mg total) by mouth daily. 08/28/16   Ellyn Hack, MD  metFORMIN (GLUCOPHAGE) 1000 MG tablet Take 1 tablet (1,000 mg total) by mouth 2 (two) times daily with a meal. 12/17/16   Ellyn Hack, MD  simvastatin (ZOCOR) 20 MG tablet Take 1 tablet (20 mg total) by mouth at bedtime. 05/29/16   Ellyn Hack, MD    Allergies as of 11/20/2016  . (No Known Allergies)    Family History  Problem Relation Age of Onset  . Hypertension Mother   . Diabetes Father     Social History   Social History  . Marital status: Single    Spouse name: N/A  . Number of children: N/A  . Years of  education: N/A   Occupational History  . Not on file.   Social History Main Topics  . Smoking status: Current Every Day Smoker    Packs/day: 10.00    Types: Cigarettes  . Smokeless tobacco: Never Used  . Alcohol use 0.0 oz/week     Comment: occasional  . Drug use: No  . Sexual activity: Not on file   Other Topics Concern  . Not on file   Social History Narrative  . No narrative on file    Review of Systems: See HPI, otherwise negative ROS  Physical Exam: BP (!) 152/71   Pulse (!) 58   Temp 97 F (36.1 C) (Tympanic)   Resp 14   Ht  (1.702 m)   Wt 142 lb (64.4 kg)   SpO2 100%   BMI 22.24 kg/m  General:   Alert,  pleasant and cooperative in NAD Head:  Normocephalic and atraumatic. Neck:  Supple; no masses or thyromegaly. Lungs:  Clear throughout to auscultation.    Heart:  Regular rate and rhythm. Abdomen:  Soft, nontender and nondistended. Normal bowel sounds, without guarding, and without rebound.   Neurologic:  Alert and  oriented x4;  grossly normal neurologically.  Impression/Plan: Larry Robinson is here for an colonoscopy to be performed for Screening colonoscopy average risk  Risks, benefits, limitations, and alternatives regarding  colonoscopy have been reviewed with the patient.  Questions have been answered.  All parties agreeable.   Wyline Mood, MD  12/26/2016, 9:20 AM

## 2016-12-26 NOTE — Transfer of Care (Signed)
Immediate Anesthesia Transfer of Care Note  Patient: Larry Robinson  Procedure(s) Performed: Procedure(s): COLONOSCOPY WITH PROPOFOL (N/A)  Patient Location: Endoscopy Unit  Anesthesia Type:General  Level of Consciousness: sedated  Airway & Oxygen Therapy: Patient Spontanous Breathing and Patient connected to nasal cannula oxygen  Post-op Assessment: Report given to RN and Post -op Vital signs reviewed and stable  Post vital signs: Reviewed and stable  Last Vitals:  Vitals:   12/26/16 0834  BP: (!) 152/71  Pulse: (!) 58  Resp: 14  Temp: 36.1 C    Last Pain:  Vitals:   12/26/16 0834  TempSrc: Tympanic         Complications: No apparent anesthesia complications

## 2016-12-26 NOTE — Anesthesia Preprocedure Evaluation (Signed)
Anesthesia Evaluation  Patient identified by MRN, date of birth, ID band Patient awake    Reviewed: Allergy & Precautions, NPO status , Patient's Chart, lab work & pertinent test results  History of Anesthesia Complications Negative for: history of anesthetic complications  Airway Mallampati: II  TM Distance: >3 FB Neck ROM: Full    Dental  (+) Poor Dentition   Pulmonary neg sleep apnea, neg COPD, Current Smoker,    breath sounds clear to auscultation- rhonchi (-) wheezing      Cardiovascular Exercise Tolerance: Good hypertension, Pt. on medications (-) angina(-) CAD, (-) Past MI and (-) DOE  Rhythm:Regular Rate:Normal - Systolic murmurs and - Diastolic murmurs    Neuro/Psych negative neurological ROS  negative psych ROS   GI/Hepatic negative GI ROS, Neg liver ROS,   Endo/Other  diabetes, Type 2, Insulin Dependent, Oral Hypoglycemic Agents  Renal/GU negative Renal ROS     Musculoskeletal negative musculoskeletal ROS (+)   Abdominal (+) - obese,   Peds  Hematology negative hematology ROS (+)   Anesthesia Other Findings Past Medical History: No date: Diabetes mellitus without complication (HCC) No date: Hypertension   Reproductive/Obstetrics                             Anesthesia Physical  Anesthesia Plan  ASA: III  Anesthesia Plan: General   Post-op Pain Management:    Induction: Intravenous  Airway Management Planned: Natural Airway  Additional Equipment:   Intra-op Plan:   Post-operative Plan:   Informed Consent: I have reviewed the patients History and Physical, chart, labs and discussed the procedure including the risks, benefits and alternatives for the proposed anesthesia with the patient or authorized representative who has indicated his/her understanding and acceptance.   Dental advisory given  Plan Discussed with: CRNA and Anesthesiologist  Anesthesia Plan  Comments:         Anesthesia Quick Evaluation

## 2016-12-26 NOTE — Anesthesia Post-op Follow-up Note (Signed)
Anesthesia QCDR form completed.        

## 2016-12-26 NOTE — Anesthesia Postprocedure Evaluation (Signed)
Anesthesia Post Note  Patient: Larry Robinson  Procedure(s) Performed: Procedure(s) (LRB): COLONOSCOPY WITH PROPOFOL (N/A)  Patient location during evaluation: Endoscopy Anesthesia Type: General Level of consciousness: awake and alert Pain management: pain level controlled Vital Signs Assessment: post-procedure vital signs reviewed and stable Respiratory status: spontaneous breathing, nonlabored ventilation, respiratory function stable and patient connected to nasal cannula oxygen Cardiovascular status: blood pressure returned to baseline and stable Postop Assessment: no signs of nausea or vomiting Anesthetic complications: no     Last Vitals:  Vitals:   12/26/16 1022 12/26/16 1032  BP: 139/77 (!) 159/82  Pulse: (!) 56 (!) 51  Resp: 14 13  Temp:      Last Pain:  Vitals:   12/26/16 1002  TempSrc: Axillary  PainSc: Asleep                 Cleda Mccreedy Marthena Whitmyer

## 2016-12-27 ENCOUNTER — Encounter: Payer: Self-pay | Admitting: Gastroenterology

## 2017-01-02 ENCOUNTER — Encounter: Payer: Self-pay | Admitting: Family Medicine

## 2017-01-02 ENCOUNTER — Ambulatory Visit (INDEPENDENT_AMBULATORY_CARE_PROVIDER_SITE_OTHER): Payer: PPO | Admitting: Family Medicine

## 2017-01-02 VITALS — BP 143/71 | HR 73 | Temp 97.9°F | Resp 16 | Ht 67.0 in | Wt 142.7 lb

## 2017-01-02 DIAGNOSIS — E785 Hyperlipidemia, unspecified: Secondary | ICD-10-CM | POA: Diagnosis not present

## 2017-01-02 DIAGNOSIS — I10 Essential (primary) hypertension: Secondary | ICD-10-CM

## 2017-01-02 DIAGNOSIS — Z794 Long term (current) use of insulin: Secondary | ICD-10-CM

## 2017-01-02 DIAGNOSIS — E119 Type 2 diabetes mellitus without complications: Secondary | ICD-10-CM | POA: Diagnosis not present

## 2017-01-02 LAB — COMPLETE METABOLIC PANEL WITH GFR
ALBUMIN: 4.1 g/dL (ref 3.6–5.1)
ALK PHOS: 48 U/L (ref 40–115)
ALT: 11 U/L (ref 9–46)
AST: 14 U/L (ref 10–35)
BILIRUBIN TOTAL: 0.6 mg/dL (ref 0.2–1.2)
BUN: 12 mg/dL (ref 7–25)
CO2: 31 mmol/L (ref 20–31)
CREATININE: 1.24 mg/dL (ref 0.70–1.25)
Calcium: 9.5 mg/dL (ref 8.6–10.3)
Chloride: 100 mmol/L (ref 98–110)
GFR, Est African American: 68 mL/min (ref >=60)
GFR, Est Non African American: 59 mL/min — ABNORMAL LOW (ref >=60)
GLUCOSE: 233 mg/dL — AB (ref 65–99)
Potassium: 5.5 mmol/L — ABNORMAL HIGH (ref 3.5–5.3)
Sodium: 135 mmol/L (ref 135–146)
TOTAL PROTEIN: 6.7 g/dL (ref 6.1–8.1)

## 2017-01-02 LAB — POCT GLYCOSYLATED HEMOGLOBIN (HGB A1C): Hemoglobin A1C: 7.5

## 2017-01-02 LAB — LIPID PANEL
Cholesterol: 138 mg/dL (ref <200)
HDL: 64 mg/dL (ref >40)
LDL CALC: 60 mg/dL (ref <100)
Total CHOL/HDL Ratio: 2.2 Ratio (ref <5.0)
Triglycerides: 68 mg/dL (ref <150)
VLDL: 14 mg/dL (ref <30)

## 2017-01-02 LAB — GLUCOSE, POCT (MANUAL RESULT ENTRY): POC GLUCOSE: 181 mg/dL — AB (ref 70–99)

## 2017-01-02 MED ORDER — SIMVASTATIN 20 MG PO TABS: 20.0000 mg | ORAL_TABLET | Freq: Every day | ORAL | 1 refills | Status: DC

## 2017-01-02 MED ORDER — LISINOPRIL 10 MG PO TABS: 10.0000 mg | ORAL_TABLET | Freq: Every day | ORAL | 0 refills | Status: DC

## 2017-01-02 MED ORDER — INSULIN GLARGINE 100 UNIT/ML SOLOSTAR PEN: PEN_INJECTOR | SUBCUTANEOUS | 2 refills | Status: DC

## 2017-01-02 NOTE — Progress Notes (Signed)
Name: Larry Robinson   MRN: [id number]    DOB: 1946/03/11   Date:01/02/2017       Progress Note  Subjective  Chief Complaint  Chief Complaint  Patient presents with  . Follow-up    3 mo  . Medication Refill    Diabetes  He presents for his follow-up diabetic visit. He has type 2 diabetes mellitus. His disease course has been worsening. There are no hypoglycemic associated symptoms. Pertinent negatives for hypoglycemia include no dizziness, headaches, hunger or sweats. Associated symptoms include polyuria. Pertinent negatives for diabetes include no blurred vision, no chest pain, no fatigue, no foot paresthesias and no polydipsia. Pertinent negatives for diabetic complications include no CVA. Current diabetic treatment includes intensive insulin program and oral agent (monotherapy). He is following a diabetic diet. He has had a previous visit with a dietitian. He rarely participates in exercise. His breakfast blood glucose range is generally 140-180 mg/dl. An ACE inhibitor/angiotensin II receptor blocker is being taken.  Hypertension  This is a chronic problem. The problem is unchanged. The problem is controlled. Pertinent negatives include no blurred vision, chest pain, headaches, palpitations, shortness of breath or sweats. Past treatments include ACE inhibitors. There is no history of kidney disease, CAD/MI or CVA.  Hyperlipidemia  This is a chronic problem. The problem is controlled. Recent lipid tests were reviewed and are normal. Exacerbating diseases include diabetes. Pertinent negatives include no chest pain, leg pain, myalgias or shortness of breath. Current antihyperlipidemic treatment includes statins.    Past Medical History:  Diagnosis Date  . Diabetes mellitus without complication (HCC)   . Hypertension     Past Surgical History:  Procedure Laterality Date  . COLONOSCOPY WITH PROPOFOL N/A 11/07/2016   Procedure: COLONOSCOPY WITH PROPOFOL;  Surgeon: Wyline Mood, MD;   Location: [facility] ENDOSCOPY;  Service: Endoscopy;  Laterality: N/A;  . COLONOSCOPY WITH PROPOFOL N/A 12/26/2016   Procedure: COLONOSCOPY WITH PROPOFOL;  Surgeon: Wyline Mood, MD;  Location: [facility] ENDOSCOPY;  Service: Endoscopy;  Laterality: N/A;  . FOOT SURGERY Right     Family History  Problem Relation Age of Onset  . Hypertension Mother   . Diabetes Father     Social History   Social History  . Marital status: Single    Spouse name: N/A  . Number of children: N/A  . Years of education: N/A   Occupational History  . Not on file.   Social History Main Topics  . Smoking status: Current Every Day Smoker    Packs/day: 10.00    Types: Cigarettes  . Smokeless tobacco: Never Used  . Alcohol use 0.0 oz/week     Comment: occasional  . Drug use: No  . Sexual activity: Not on file   Other Topics Concern  . Not on file   Social History Narrative  . No narrative on file     Current Outpatient Prescriptions:  .  B-D ULTRAFINE III SHORT PEN 31G X 8 MM MISC, TEST BLOOD SUGAR three times a day, Disp: , Rfl: 1 .  BD ULTRA-FINE LANCETS lancets, Use as instructed to check blood glucose three times daily., Disp: 100 each, Rfl: 12 .  insulin aspart (NOVOLOG) 100 UNIT/ML FlexPen, Inject 4 Units into the skin 2 (two) times daily with a meal., Disp: 15 mL, Rfl: 2 .  LANTUS SOLOSTAR 100 UNIT/ML Solostar Pen, inject 10 units subcutaneously once daily AT 10PM, Disp: 5 pen, Rfl: 2 .  lisinopril (PRINIVIL,ZESTRIL) 10 MG tablet, Take 1 tablet (10 mg total)  by mouth daily., Disp: 90 tablet, Rfl: 0 .  metFORMIN (GLUCOPHAGE) 1000 MG tablet, Take 1 tablet (1,000 mg total) by mouth 2 (two) times daily with a meal., Disp: 180 tablet, Rfl: 0 .  simvastatin (ZOCOR) 20 MG tablet, Take 1 tablet (20 mg total) by mouth at bedtime., Disp: 90 tablet, Rfl: 1  No Known Allergies   Review of Systems  Constitutional: Negative for fatigue.  Eyes: Negative for blurred vision.  Respiratory: Negative for shortness of  breath.   Cardiovascular: Negative for chest pain and palpitations.  Musculoskeletal: Negative for myalgias.  Neurological: Negative for dizziness and headaches.  Endo/Heme/Allergies: Negative for polydipsia.     Objective  Vitals:   01/02/17 0823  BP: (!) 143/71  Pulse: 73  Resp: 16  Temp: 97.9 F (36.6 C)  TempSrc: Oral  SpO2: 97%  Weight: 142 lb 11.2 oz (64.7 kg)  Height:  (1.702 m)    Physical Exam  Constitutional: He is oriented to person, place, and time and well-developed, well-nourished, and in no distress.  HENT:  Head: Normocephalic and atraumatic.  Eyes: Pupils are equal, round, and reactive to light.  Cardiovascular: Normal rate, regular rhythm, S1 normal, S2 normal and normal heart sounds.   No murmur heard. Pulmonary/Chest: Effort normal and breath sounds normal. He has no wheezes. He has no rhonchi.  Abdominal: Soft. Bowel sounds are normal. There is no tenderness.  Genitourinary: Rectum normal. Prostate is not tender.  Musculoskeletal:       Right ankle: He exhibits no swelling.       Left ankle: He exhibits no swelling.  Neurological: He is alert and oriented to person, place, and time.  Skin: Skin is warm, dry and intact.  Psychiatric: Mood, memory, affect and judgment normal.  Nursing note and vitals reviewed.     Assessment & Plan  1. Type 2 diabetes mellitus without complication, with long-term current use of insulin (HCC) Point-of-care A1c 7.5%, worse compared to 6.4% from 3 months ago, will increase Lantus to 12 units at bedtime, educated on dietary and lifestyle adherence, reevaluate in 3 months - POCT HgB A1C - POCT Glucose (CBG) - Insulin Glargine (LANTUS SOLOSTAR) 100 UNIT/ML Solostar Pen; inject 12 units subcutaneously once daily AT 10PM  Dispense: 5 pen; Refill: 2  2. Essential hypertension Marginally elevated systolic blood pressure, no change in pharmacotherapy, reassess in 3 months - lisinopril (PRINIVIL,ZESTRIL) 10 MG tablet;  Take 1 tablet (10 mg total) by mouth daily.  Dispense: 90 tablet; Refill: 0  3. Dyslipidemia  - simvastatin (ZOCOR) 20 MG tablet; Take 1 tablet (20 mg total) by mouth at bedtime.  Dispense: 90 tablet; Refill: 1 - COMPLETE METABOLIC PANEL WITH GFR - Lipid panel  Makailee Nudelman Asad [facility] 01/02/2017 8:35 AM

## 2017-03-20 ENCOUNTER — Telehealth: Payer: Self-pay | Admitting: Family Medicine

## 2017-03-20 DIAGNOSIS — Z794 Long term (current) use of insulin: Principal | ICD-10-CM

## 2017-03-20 DIAGNOSIS — E119 Type 2 diabetes mellitus without complications: Secondary | ICD-10-CM

## 2017-03-20 MED ORDER — METFORMIN HCL 1000 MG PO TABS: 1000.0000 mg | ORAL_TABLET | Freq: Two times a day (BID) | ORAL | 0 refills | Status: DC

## 2017-03-20 NOTE — Telephone Encounter (Signed)
Requesting refill novolog flex pen and metformin 1000mg . Please send to [facility]

## 2017-03-20 NOTE — Telephone Encounter (Signed)
Medication has been refilled and sent to [facility] [address] 

## 2017-03-21 ENCOUNTER — Other Ambulatory Visit: Payer: Self-pay | Admitting: Family Medicine

## 2017-03-21 DIAGNOSIS — Z794 Long term (current) use of insulin: Principal | ICD-10-CM

## 2017-03-21 DIAGNOSIS — E119 Type 2 diabetes mellitus without complications: Secondary | ICD-10-CM

## 2017-04-03 ENCOUNTER — Ambulatory Visit: Payer: PPO | Admitting: Family Medicine

## 2017-04-15 ENCOUNTER — Ambulatory Visit: Payer: PPO | Admitting: Family Medicine

## 2017-04-22 ENCOUNTER — Encounter: Payer: Self-pay | Admitting: Family Medicine

## 2017-04-22 ENCOUNTER — Ambulatory Visit (INDEPENDENT_AMBULATORY_CARE_PROVIDER_SITE_OTHER): Payer: PPO | Admitting: Family Medicine

## 2017-04-22 VITALS — BP 140/73 | HR 69 | Temp 98.2°F | Resp 16 | Ht 67.0 in | Wt 135.9 lb

## 2017-04-22 DIAGNOSIS — E119 Type 2 diabetes mellitus without complications: Secondary | ICD-10-CM | POA: Diagnosis not present

## 2017-04-22 DIAGNOSIS — E785 Hyperlipidemia, unspecified: Secondary | ICD-10-CM | POA: Diagnosis not present

## 2017-04-22 DIAGNOSIS — Z794 Long term (current) use of insulin: Secondary | ICD-10-CM | POA: Diagnosis not present

## 2017-04-22 DIAGNOSIS — Z720 Tobacco use: Secondary | ICD-10-CM

## 2017-04-22 DIAGNOSIS — I10 Essential (primary) hypertension: Secondary | ICD-10-CM

## 2017-04-22 LAB — GLUCOSE, POCT (MANUAL RESULT ENTRY): POC GLUCOSE: 152 mg/dL — AB (ref 70–99)

## 2017-04-22 MED ORDER — INSULIN GLARGINE 100 UNIT/ML SOLOSTAR PEN: PEN_INJECTOR | SUBCUTANEOUS | 2 refills | Status: DC

## 2017-04-22 MED ORDER — INSULIN ASPART 100 UNIT/ML FLEXPEN: PEN_INJECTOR | SUBCUTANEOUS | 2 refills | Status: DC

## 2017-04-22 MED ORDER — SIMVASTATIN 20 MG PO TABS: 20.0000 mg | ORAL_TABLET | Freq: Every day | ORAL | 1 refills | Status: DC

## 2017-04-22 MED ORDER — METFORMIN HCL 1000 MG PO TABS: 1000.0000 mg | ORAL_TABLET | Freq: Two times a day (BID) | ORAL | 0 refills | Status: DC

## 2017-04-22 MED ORDER — LISINOPRIL 10 MG PO TABS: 10.0000 mg | ORAL_TABLET | Freq: Every day | ORAL | 0 refills | Status: DC

## 2017-04-22 NOTE — Progress Notes (Signed)
Name: Larry Robinson   MRN: [id number]    DOB: [birth year]   Date:04/22/2017       Progress Note  Subjective  Chief Complaint  Chief Complaint  Patient presents with  . Follow-up    3 mo  . Medication Refill    Diabetes  He presents for his follow-up diabetic visit. He has type 2 diabetes mellitus. His disease course has been stable. There are no hypoglycemic associated symptoms. Pertinent negatives for hypoglycemia include no dizziness, headaches, hunger or sweats. Associated symptoms include polyuria. Pertinent negatives for diabetes include no blurred vision, no chest pain, no fatigue, no foot paresthesias and no polydipsia. Pertinent negatives for diabetic complications include no CVA. Current diabetic treatment includes intensive insulin program and oral agent (monotherapy). He is following a diabetic and generally healthy diet. He has had a previous visit with a dietitian. He rarely (walks regularly.) participates in exercise. His breakfast blood glucose range is generally 140-180 mg/dl. An ACE inhibitor/angiotensin II receptor blocker is being taken.  Hypertension  This is a chronic problem. The problem is unchanged. The problem is controlled. Pertinent negatives include no blurred vision, chest pain, headaches, palpitations, shortness of breath or sweats. Past treatments include ACE inhibitors. There is no history of kidney disease, CAD/MI or CVA.  Hyperlipidemia  This is a chronic problem. The problem is controlled. Recent lipid tests were reviewed and are normal. Exacerbating diseases include diabetes. Pertinent negatives include no chest pain, leg pain, myalgias or shortness of breath. Current antihyperlipidemic treatment includes statins.  Nicotine Dependence  Presents for initial visit. Symptoms include cravings. Symptoms are negative for fatigue. Preferred tobacco types include cigarettes. Preferred cigarette types include filtered. Preferred strength is regular. Preferred  cigarettes are menthol. Preferred brands include Kool. His urge triggers include company of smokers, driving and meal time. Risk factors do not include drinking coffee or stress.His first smoke is before 6 AM. He smokes < 1/2 a pack of cigarettes per day. He started smoking when he was >71 years old. Past treatments include nothing. [name] is thinking about quitting. [name] has tried to quit 0 times.    Past Medical History:  Diagnosis Date  . Diabetes mellitus without complication (HCC)   . Hypertension     Past Surgical History:  Procedure Laterality Date  . COLONOSCOPY WITH PROPOFOL N/A 11/07/2016   Procedure: COLONOSCOPY WITH PROPOFOL;  Surgeon: Wyline MoodKiran Anna, MD;  [facility];  Service: Endoscopy;  Laterality: N/A;  . COLONOSCOPY WITH PROPOFOL N/A 12/26/2016   Procedure: COLONOSCOPY WITH PROPOFOL;  Surgeon: Wyline MoodKiran Anna, MD;  Location: [facility] ENDOSCOPY;  Service: Endoscopy;  Laterality: N/A;  . FOOT SURGERY Right     Family History  Problem Relation Age of Onset  . Hypertension Mother   . Diabetes Father     Social History   Social History  . Marital status: Single    Spouse name: N/A  . Number of children: N/A  . Years of education: N/A   Occupational History  . Not on file.   Social History Main Topics  . Smoking status: Current Every Day Smoker    Packs/day: 10.00    Types: Cigarettes  . Smokeless tobacco: Never Used  . Alcohol use 0.0 oz/week     Comment: occasional  . Drug use: No  . Sexual activity: Not on file   Other Topics Concern  . Not on file   Social History Narrative  . No narrative on file     Current Outpatient Prescriptions:  .  B-D ULTRAFINE III SHORT PEN 31G X 8 MM MISC, TEST BLOOD SUGAR three times a day, Disp: , Rfl: 1 .  BD ULTRA-FINE LANCETS lancets, Use as instructed to check blood glucose three times daily., Disp: 100 each, Rfl: 12 .  Insulin Glargine (LANTUS SOLOSTAR) 100 UNIT/ML Solostar Pen, inject 12 units subcutaneously  once daily AT 10PM, Disp: 5 pen, Rfl: 2 .  lisinopril (PRINIVIL,ZESTRIL) 10 MG tablet, Take 1 tablet (10 mg total) by mouth daily., Disp: 90 tablet, Rfl: 0 .  metFORMIN (GLUCOPHAGE) 1000 MG tablet, Take 1 tablet (1,000 mg total) by mouth 2 (two) times daily with a meal., Disp: 60 tablet, Rfl: 0 .  NOVOLOG FLEXPEN 100 UNIT/ML FlexPen, inject 4 units subcutaneously twice a day with meals, Disp: 15 mL, Rfl: 2 .  simvastatin (ZOCOR) 20 MG tablet, Take 1 tablet (20 mg total) by mouth at bedtime., Disp: 90 tablet, Rfl: 1  No Known Allergies   Review of Systems  Constitutional: Negative for fatigue.  Eyes: Negative for blurred vision.  Respiratory: Negative for shortness of breath.   Cardiovascular: Negative for chest pain and palpitations.  Musculoskeletal: Negative for myalgias.  Neurological: Negative for dizziness and headaches.  Endo/Heme/Allergies: Negative for polydipsia.      Objective  Vitals:   04/22/17 1030  BP: 140/73  Pulse: 69  Resp: 16  Temp: 98.2 F (36.8 C)  TempSrc: Oral  SpO2: 94%  Weight: 135 lb 14.4 oz (61.6 kg)  Height: 5\' 7"  (1.702 m)    Physical Exam  Constitutional: He is oriented to person, place, and time and well-developed, well-nourished, and in no distress.  HENT:  Head: Normocephalic and atraumatic.  Cardiovascular: Normal rate, regular rhythm, S1 normal, S2 normal and normal heart sounds.   No murmur heard. Pulmonary/Chest: Effort normal and breath sounds normal. He has no wheezes. He has no rhonchi.  Abdominal: Soft. Bowel sounds are normal. There is no tenderness.  Musculoskeletal:       Right ankle: He exhibits no swelling.       Left ankle: He exhibits no swelling.  Neurological: He is alert and oriented to person, place, and time.  Skin: Skin is warm and dry.  Psychiatric: Mood, memory, affect and judgment normal.  Nursing note and vitals reviewed.     Recent Results (from the past 2160 hour(s))  POCT Glucose (CBG)     Status:  Abnormal   Collection Time: 04/22/17 10:39 AM  Result Value Ref Range   POC Glucose 152 (A) 70 - 99 mg/dl     Assessment & Plan  1. Type 2 diabetes mellitus without complication, with long-term current use of insulin (HCC) Continue on metformin and insulin treatment, obtain A1c - POCT Glucose (CBG) - metFORMIN (GLUCOPHAGE) 1000 MG tablet; Take 1 tablet (1,000 mg total) by mouth 2 (two) times daily with a meal.  Dispense: 60 tablet; Refill: 0 - insulin aspart (NOVOLOG FLEXPEN) 100 UNIT/ML FlexPen; inject 4 units subcutaneously twice a day with meals  Dispense: 15 mL; Refill: 2 - Insulin Glargine (LANTUS SOLOSTAR) 100 UNIT/ML Solostar Pen; inject 12 units subcutaneously once daily AT 10PM  Dispense: 5 pen; Refill: 2 - HgB A1c  2. Essential hypertension  - lisinopril (PRINIVIL,ZESTRIL) 10 MG tablet; Take 1 tablet (10 mg total) by mouth daily.  Dispense: 90 tablet; Refill: 0  3. Dyslipidemia  - simvastatin (ZOCOR) 20 MG tablet; Take 1 tablet (20 mg total) by mouth at bedtime.  Dispense: 90 tablet; Refill: 1  4. Tobacco abuse  patient is not completely ready to quit at this time, aware of the dangers of tobacco, follow-up when he is completely ready to quit   [facility] A. [facility] 04/22/2017 11:03 AM

## 2017-04-23 LAB — HEMOGLOBIN A1C
HEMOGLOBIN A1C: 6 % — AB (ref <5.7)
Mean Plasma Glucose: 126 mg/dL

## 2017-06-04 ENCOUNTER — Telehealth: Payer: Self-pay | Admitting: Family Medicine

## 2017-06-04 ENCOUNTER — Other Ambulatory Visit: Payer: Self-pay | Admitting: Family Medicine

## 2017-06-04 DIAGNOSIS — E119 Type 2 diabetes mellitus without complications: Secondary | ICD-10-CM

## 2017-06-04 DIAGNOSIS — Z794 Long term (current) use of insulin: Principal | ICD-10-CM

## 2017-06-04 MED ORDER — METFORMIN HCL 1000 MG PO TABS: 1000.0000 mg | ORAL_TABLET | Freq: Two times a day (BID) | ORAL | 0 refills | Status: DC

## 2017-06-04 NOTE — Telephone Encounter (Signed)
Requesting refill on metformin. Have about a day worth left. Please send to [facility]

## 2017-06-04 NOTE — Telephone Encounter (Signed)
Medication has been refilled and sent to [facility] [address] 

## 2017-06-09 ENCOUNTER — Ambulatory Visit: Payer: PPO

## 2017-07-07 ENCOUNTER — Ambulatory Visit (INDEPENDENT_AMBULATORY_CARE_PROVIDER_SITE_OTHER): Payer: PPO

## 2017-07-07 VITALS — Ht 67.0 in | Wt 138.4 lb

## 2017-07-07 DIAGNOSIS — Z0000 Encounter for general adult medical examination without abnormal findings: Secondary | ICD-10-CM | POA: Diagnosis not present

## 2017-07-07 NOTE — Patient Instructions (Signed)
Larry Robinson , Thank you for taking time to come for your Medicare Wellness Visit. I appreciate your ongoing commitment to your health goals. Please review the following plan we discussed and let me know if I can assist you in the future.   Screening recommendations/referrals: Colonoscopy: Up to date Recommended yearly ophthalmology/optometry visit for glaucoma screening and checkup Recommended yearly dental visit for hygiene and checkup  Vaccinations: Influenza vaccine: Up to date Pneumococcal vaccine: completed series Tdap vaccine: declined Shingles vaccine: declined  Advanced directives: Advance directive discussed with you today. Even though you declined this today please call our office should you change your mind and we can give you the proper paperwork for you to fill out.  Conditions/risks identified: Recommend to quit smoking. Pt to taper down from half a pack of cigarettes a day to none.   Next appointment: 07/23/17  Preventive Care 65 Years and Older, Male Preventive care refers to lifestyle choices and visits with your health care provider that can promote health and wellness. What does preventive care include?  A yearly physical exam. This is also called an annual well check.  Dental exams once or twice a year.  Routine eye exams. Ask your health care provider how often you should have your eyes checked.  Personal lifestyle choices, including:  Daily care of your teeth and gums.  Regular physical activity.  Eating a healthy diet.  Avoiding tobacco and drug use.  Limiting alcohol use.  Practicing safe sex.  Taking low doses of aspirin every day.  Taking vitamin and mineral supplements as recommended by your health care provider. What happens during an annual well check? The services and screenings done by your health care provider during your annual well check will depend on your age, overall health, lifestyle risk factors, and family history of  disease. Counseling  Your health care provider may ask you questions about your:  Alcohol use.  Tobacco use.  Drug use.  Emotional well-being.  Home and relationship well-being.  Sexual activity.  Eating habits.  History of falls.  Memory and ability to understand (cognition).  Work and work Astronomer. Screening  You may have the following tests or measurements:  Height, weight, and BMI.  Blood pressure.  Lipid and cholesterol levels. These may be checked every 5 years, or more frequently if you are over 50 years old.  Skin check.  Lung cancer screening. You may have this screening every year starting at age 4 if you have a 30-pack-year history of smoking and currently smoke or have quit within the past 15 years.  Fecal occult blood test (FOBT) of the stool. You may have this test every year starting at age 83.  Flexible sigmoidoscopy or colonoscopy. You may have a sigmoidoscopy every 5 years or a colonoscopy every 10 years starting at age 58.  Prostate cancer screening. Recommendations will vary depending on your family history and other risks.  Hepatitis C blood test.  Hepatitis B blood test.  Sexually transmitted disease (STD) testing.  Diabetes screening. This is done by checking your blood sugar (glucose) after you have not eaten for a while (fasting). You may have this done every 1-3 years.  Abdominal aortic aneurysm (AAA) screening. You may need this if you are a current or former smoker.  Osteoporosis. You may be screened starting at age 77 if you are at high risk. Talk with your health care provider about your test results, treatment options, and if necessary, the need for more tests. Vaccines  Your health care provider may recommend certain vaccines, such as:  Influenza vaccine. This is recommended every year.  Tetanus, diphtheria, and acellular pertussis (Tdap, Td) vaccine. You may need a Td booster every 10 years.  Zoster vaccine. You may  need this after age 54.  Pneumococcal 13-valent conjugate (PCV13) vaccine. One dose is recommended after age 45.  Pneumococcal polysaccharide (PPSV23) vaccine. One dose is recommended after age 101. Talk to your health care provider about which screenings and vaccines you need and how often you need them. This information is not intended to replace advice given to you by your health care provider. Make sure you discuss any questions you have with your health care provider. Document Released: 10/06/2015 Document Revised: 05/29/2016 Document Reviewed: 07/11/2015 Elsevier Interactive Patient Education  2017 [place] Prevention in the Home Falls can cause injuries. They can happen to people of all ages. There are many things you can do to make your home safe and to help prevent falls. What can I do on the outside of my home?  Regularly fix the edges of walkways and driveways and fix any cracks.  Remove anything that might make you trip as you walk through a door, such as a raised step or threshold.  Trim any bushes or trees on the path to your home.  Use bright outdoor lighting.  Clear any walking paths of anything that might make someone trip, such as rocks or tools.  Regularly check to see if handrails are loose or broken. Make sure that both sides of any steps have handrails.  Any raised decks and porches should have guardrails on the edges.  Have any leaves, snow, or ice cleared regularly.  Use sand or salt on walking paths during winter.  Clean up any spills in your garage right away. This includes oil or grease spills. What can I do in the bathroom?  Use night lights.  Install grab bars by the toilet and in the tub and shower. Do not use towel bars as grab bars.  Use non-skid mats or decals in the tub or shower.  If you need to sit down in the shower, use a plastic, non-slip stool.  Keep the floor dry. Clean up any water that spills on the floor as soon as it  happens.  Remove soap buildup in the tub or shower regularly.  Attach bath mats securely with double-sided non-slip rug tape.  Do not have throw rugs and other things on the floor that can make you trip. What can I do in the bedroom?  Use night lights.  Make sure that you have a light by your bed that is easy to reach.  Do not use any sheets or blankets that are too big for your bed. They should not hang down onto the floor.  Have a firm chair that has side arms. You can use this for support while you get dressed.  Do not have throw rugs and other things on the floor that can make you trip. What can I do in the kitchen?  Clean up any spills right away.  Avoid walking on wet floors.  Keep items that you use a lot in easy-to-reach places.  If you need to reach something above you, use a strong step stool that has a grab bar.  Keep electrical cords out of the way.  Do not use floor polish or wax that makes floors slippery. If you must use wax, use non-skid floor wax.  Do  not have throw rugs and other things on the floor that can make you trip. What can I do with my stairs?  Do not leave any items on the stairs.  Make sure that there are handrails on both sides of the stairs and use them. Fix handrails that are broken or loose. Make sure that handrails are as long as the stairways.  Check any carpeting to make sure that it is firmly attached to the stairs. Fix any carpet that is loose or worn.  Avoid having throw rugs at the top or bottom of the stairs. If you do have throw rugs, attach them to the floor with carpet tape.  Make sure that you have a light switch at the top of the stairs and the bottom of the stairs. If you do not have them, ask someone to add them for you. What else can I do to help prevent falls?  Wear shoes that:  Do not have high heels.  Have rubber bottoms.  Are comfortable and fit you well.  Are closed at the toe. Do not wear sandals.  If you  use a stepladder:  Make sure that it is fully opened. Do not climb a closed stepladder.  Make sure that both sides of the stepladder are locked into place.  Ask someone to hold it for you, if possible.  Clearly mark and make sure that you can see:  Any grab bars or handrails.  First and last steps.  Where the edge of each step is.  Use tools that help you move around (mobility aids) if they are needed. These include:  Canes.  Walkers.  Scooters.  Crutches.  Turn on the lights when you go into a dark area. Replace any light bulbs as soon as they burn out.  Set up your furniture so you have a clear path. Avoid moving your furniture around.  If any of your floors are uneven, fix them.  If there are any pets around you, be aware of where they are.  Review your medicines with your doctor. Some medicines can make you feel dizzy. This can increase your chance of falling. Ask your doctor what other things that you can do to help prevent falls. This information is not intended to replace advice given to you by your health care provider. Make sure you discuss any questions you have with your health care provider. Document Released: 07/06/2009 Document Revised: 02/15/2016 Document Reviewed: 10/14/2014 Elsevier Interactive Patient Education  2017 [employer].

## 2017-07-07 NOTE — Progress Notes (Signed)
Subjective:   Larry Robinson is a 71 y.o. male who presents for Medicare Annual/Subsequent preventive examination.  Review of Systems:  N/A  Cardiac Risk Factors include: advanced age (>19men, >67 women);diabetes mellitus;dyslipidemia;hypertension;male gender;smoking/ tobacco exposure     Objective:    Vitals: Ht  (1.702 m)   Wt 138 lb 6.4 oz (62.8 kg)   BMI 21.68 kg/m   Body mass index is 21.68 kg/m.  Tobacco History  Smoking Status  . Current Every Day Smoker  . Packs/day: 0.50  . Types: Cigarettes  Smokeless Tobacco  . Never Used     Ready to quit: Yes Counseling given: No   Past Medical History:  Diagnosis Date  . Diabetes mellitus without complication (HCC)   . Hyperlipidemia   . Hypertension    Past Surgical History:  Procedure Laterality Date  . COLONOSCOPY WITH PROPOFOL N/A 11/07/2016   Procedure: COLONOSCOPY WITH PROPOFOL;  Surgeon: Wyline Mood, MD;  Location: [facility] ENDOSCOPY;  Service: Endoscopy;  Laterality: N/A;  . COLONOSCOPY WITH PROPOFOL N/A 12/26/2016   Procedure: COLONOSCOPY WITH PROPOFOL;  Surgeon: Wyline Mood, MD;  Location: [facility] ENDOSCOPY;  Service: Endoscopy;  Laterality: N/A;  . FOOT SURGERY Right    Family History  Problem Relation Age of Onset  . Hypertension Mother   . Diabetes Father    History  Sexual Activity  . Sexual activity: Not on file    Outpatient Encounter Prescriptions as of 07/07/2017  Medication Sig  . B-D ULTRAFINE III SHORT PEN 31G X 8 MM MISC TEST BLOOD SUGAR three times a day  . BD ULTRA-FINE LANCETS lancets Use as instructed to check blood glucose three times daily.  . insulin aspart (NOVOLOG FLEXPEN) 100 UNIT/ML FlexPen inject 4 units subcutaneously twice a day with meals  . Insulin Glargine (LANTUS SOLOSTAR) 100 UNIT/ML Solostar Pen inject 12 units subcutaneously once daily AT 10PM  . lisinopril (PRINIVIL,ZESTRIL) 10 MG tablet Take 1 tablet (10 mg total) by mouth daily.  . metFORMIN (GLUCOPHAGE) 1000 MG  tablet Take 1 tablet (1,000 mg total) by mouth 2 (two) times daily with a meal.  . Multiple Vitamin (MULTIVITAMIN) tablet Take 1 tablet by mouth daily.  . simvastatin (ZOCOR) 20 MG tablet Take 1 tablet (20 mg total) by mouth at bedtime.   No facility-administered encounter medications on file as of 07/07/2017.     Activities of Daily Living In your present state of health, do you have any difficulty performing the following activities: 07/07/2017 04/22/2017  Hearing? N N  Vision? N Y  Comment - glasses  Difficulty concentrating or making decisions? N N  Walking or climbing stairs? N N  Dressing or bathing? N N  Doing errands, shopping? N N  Preparing Food and eating ? N -  Using the Toilet? N -  In the past six months, have you accidently leaked urine? N -  Do you have problems with loss of bowel control? N -  Managing your Medications? N -  Managing your Finances? N -  Housekeeping or managing your Housekeeping? N -  Some recent data might be hidden    Patient Care Team: Ellyn Hack, MD as PCP - General (Family Medicine)   Assessment:     Exercise Activities and Dietary recommendations Current Exercise Habits: Home exercise routine, Type of exercise: walking, Time (Minutes): 60, Frequency (Times/Week): 3, Weekly Exercise (Minutes/Week): 180, Intensity: Mild  Goals    . Quit smoking / using tobacco  Recommend to quit smoking. Pt to taper down from half a pack of cigarettes a day to none.       Fall Risk Fall Risk  07/07/2017 04/22/2017 01/02/2017 09/30/2016 05/29/2016  Falls in the past year? No No No No No   Depression Screen PHQ 2/9 Scores 07/07/2017 04/22/2017 01/02/2017 09/30/2016  PHQ - 2 Score 0 0 0 0    Cognitive Function: Pt declined screening today.        Immunization History  Administered Date(s) Administered  . Influenza Split 05/24/2015, 06/05/2017  . Influenza, High Dose Seasonal PF 07/21/2014  . Pneumococcal Conjugate-13 06/30/2016  .  Pneumococcal Polysaccharide-23 07/03/2010   Screening Tests Health Maintenance  Topic Date Due  . Hepatitis C Screening  03-Sep-1946  . TETANUS/TDAP  11/04/1966  . OPHTHALMOLOGY EXAM  03/23/2017  . PNA vac Low Risk Adult (2 of 2 - PPSV23) 06/30/2017  . FOOT EXAM  08/28/2017  . HEMOGLOBIN A1C  10/23/2017  . COLONOSCOPY  12/27/2026  . INFLUENZA VACCINE  Completed      Plan:  I have personally reviewed and addressed the Medicare Annual Wellness questionnaire and have noted the following in the patient's chart:  A. Medical and social history B. Use of alcohol, tobacco or illicit drugs  C. Current medications and supplements D. Functional ability and status E.  Nutritional status F.  Physical activity G. Advance directives H. List of other physicians I.  Hospitalizations, surgeries, and ER visits in previous 12 months J.  Vitals K. Screenings such as hearing and vision if needed, cognitive and depression L. Referrals and appointments - none  In addition, I have reviewed and discussed with patient certain preventive protocols, quality metrics, and best practice recommendations. A written personalized care plan for preventive services as well as general preventive health recommendations were provided to patient.  See attached scanned questionnaire for additional information.   Signed,  Hyacinth Meeker, LPN Nurse Health Advisor   MD Recommendations: Pt declined tetanus vaccine today. Pt would like to have the Hepatitis C blood test completed at next OV on 07/23/17. Pt would like blood work done all together. Pt to schedule an eye exam by the end of 2018.

## 2017-07-23 ENCOUNTER — Encounter: Payer: Self-pay | Admitting: Family Medicine

## 2017-07-23 ENCOUNTER — Ambulatory Visit (INDEPENDENT_AMBULATORY_CARE_PROVIDER_SITE_OTHER): Payer: PPO | Admitting: Family Medicine

## 2017-07-23 VITALS — BP 104/58 | HR 68 | Temp 98.6°F | Resp 16 | Ht 67.0 in | Wt 134.7 lb

## 2017-07-23 DIAGNOSIS — E785 Hyperlipidemia, unspecified: Secondary | ICD-10-CM | POA: Diagnosis not present

## 2017-07-23 DIAGNOSIS — Z794 Long term (current) use of insulin: Secondary | ICD-10-CM

## 2017-07-23 DIAGNOSIS — E119 Type 2 diabetes mellitus without complications: Secondary | ICD-10-CM | POA: Diagnosis not present

## 2017-07-23 DIAGNOSIS — I10 Essential (primary) hypertension: Secondary | ICD-10-CM | POA: Diagnosis not present

## 2017-07-23 LAB — COMPLETE METABOLIC PANEL WITH GFR
AG RATIO: 1.8 (calc) (ref 1.0–2.5)
ALT: 13 U/L (ref 9–46)
AST: 19 U/L (ref 10–35)
Albumin: 4.4 g/dL (ref 3.6–5.1)
Alkaline phosphatase (APISO): 40 U/L (ref 40–115)
BILIRUBIN TOTAL: 0.6 mg/dL (ref 0.2–1.2)
BUN: 19 mg/dL (ref 7–25)
CALCIUM: 9.5 mg/dL (ref 8.6–10.3)
CO2: 30 mmol/L (ref 20–32)
Chloride: 103 mmol/L (ref 98–110)
Creat: 1.18 mg/dL (ref 0.70–1.25)
GFR, EST NON AFRICAN AMERICAN: 63 mL/min/{1.73_m2} (ref >=60)
GFR, Est African American: 73 mL/min/{1.73_m2} (ref >=60)
GLOBULIN: 2.5 g/dL (ref 1.9–3.7)
Glucose, Bld: 84 mg/dL (ref 65–99)
Potassium: 4.6 mmol/L (ref 3.5–5.3)
Sodium: 139 mmol/L (ref 135–146)
TOTAL PROTEIN: 6.9 g/dL (ref 6.1–8.1)

## 2017-07-23 LAB — LIPID PANEL
CHOL/HDL RATIO: 1.9 (calc) (ref <5.0)
CHOLESTEROL: 156 mg/dL (ref <200)
HDL: 81 mg/dL (ref >40)
LDL Cholesterol (Calc): 62 mg/dL (calc)
NON-HDL CHOLESTEROL (CALC): 75 mg/dL (ref <130)
TRIGLYCERIDES: 54 mg/dL (ref <150)

## 2017-07-23 LAB — POCT GLYCOSYLATED HEMOGLOBIN (HGB A1C): Hemoglobin A1C: 6.3

## 2017-07-23 LAB — GLUCOSE, POCT (MANUAL RESULT ENTRY): POC Glucose: 82 mg/dl (ref 70–99)

## 2017-07-23 MED ORDER — LISINOPRIL 10 MG PO TABS: 10.0000 mg | ORAL_TABLET | Freq: Every day | ORAL | 0 refills | Status: DC

## 2017-07-23 NOTE — Progress Notes (Signed)
Name: Larry Robinson   MRN: [id number]    DOB: 1946-08-29   Date:07/23/2017       Progress Note  Subjective  Chief Complaint  Chief Complaint  Patient presents with  . Follow-up    3 mnths with fasting labs  . Medication Refill    insulin pens, metformin and Bp meds    Diabetes  He presents for his follow-up diabetic visit. He has type 2 diabetes mellitus. His disease course has been stable. There are no hypoglycemic associated symptoms. Pertinent negatives for hypoglycemia include no dizziness, headaches, hunger or sweats. Associated symptoms include polyuria. Pertinent negatives for diabetes include no blurred vision, no chest pain, no fatigue, no foot paresthesias and no polydipsia. Pertinent negatives for diabetic complications include no CVA, heart disease or peripheral neuropathy. Current diabetic treatment includes intensive insulin program and oral agent (monotherapy). He is following a generally healthy diet. He has had a previous visit with a dietitian. He rarely participates in exercise. He monitors blood glucose at home 3-4 x per week. His breakfast blood glucose range is generally 110-130 mg/dl. An ACE inhibitor/angiotensin II receptor blocker is being taken.  Hypertension  This is a chronic problem. The problem is unchanged. The problem is controlled. Pertinent negatives include no blurred vision, chest pain, headaches, palpitations, shortness of breath or sweats. Past treatments include ACE inhibitors. There is no history of kidney disease, CAD/MI or CVA.  Hyperlipidemia  This is a chronic problem. The problem is controlled. Recent lipid tests were reviewed and are normal. Exacerbating diseases include diabetes. Pertinent negatives include no chest pain, leg pain, myalgias or shortness of breath. Current antihyperlipidemic treatment includes statins.     Past Medical History:  Diagnosis Date  . Diabetes mellitus without complication (HCC)   . Hyperlipidemia   .  Hypertension     Past Surgical History:  Procedure Laterality Date  . COLONOSCOPY WITH PROPOFOL N/A 11/07/2016   Procedure: COLONOSCOPY WITH PROPOFOL;  Surgeon: Wyline Mood, MD;  [facility] ENDOSCOPY;  Service: Endoscopy;  Laterality: N/A;  . COLONOSCOPY WITH PROPOFOL N/A 12/26/2016   Procedure: COLONOSCOPY WITH PROPOFOL;  Surgeon: Wyline Mood, MD;  Location: [facility] ENDOSCOPY;  Service: Endoscopy;  Laterality: N/A;  . FOOT SURGERY Right     Family History  Problem Relation Age of Onset  . Hypertension Mother   . Diabetes Father     Social History   Social History  . Marital status: Single    Spouse name: N/A  . Number of children: N/A  . Years of education: N/A   Occupational History  . Not on file.   Social History Main Topics  . Smoking status: Current Every Day Smoker    Packs/day: 0.50    Types: Cigarettes  . Smokeless tobacco: Never Used  . Alcohol use 0.6 oz/week    1 Glasses of wine per week     Comment: occasional- wine  . Drug use: No  . Sexual activity: Not Currently   Other Topics Concern  . Not on file   Social History Narrative  . No narrative on file     Current Outpatient Prescriptions:  .  B-D ULTRAFINE III SHORT PEN 31G X 8 MM MISC, TEST BLOOD SUGAR three times a day, Disp: , Rfl: 1 .  BD ULTRA-FINE LANCETS lancets, Use as instructed to check blood glucose three times daily., Disp: 100 each, Rfl: 12 .  insulin aspart (NOVOLOG FLEXPEN) 100 UNIT/ML FlexPen, inject 4 units subcutaneously twice a day with meals,  Disp: 15 mL, Rfl: 2 .  Insulin Glargine (LANTUS SOLOSTAR) 100 UNIT/ML Solostar Pen, inject 12 units subcutaneously once daily AT 10PM, Disp: 5 pen, Rfl: 2 .  lisinopril (PRINIVIL,ZESTRIL) 10 MG tablet, Take 1 tablet (10 mg total) by mouth daily., Disp: 90 tablet, Rfl: 0 .  metFORMIN (GLUCOPHAGE) 1000 MG tablet, Take 1 tablet (1,000 mg total) by mouth 2 (two) times daily with a meal., Disp: 180 tablet, Rfl: 0 .  Multiple Vitamin (MULTIVITAMIN)  tablet, Take 1 tablet by mouth daily., Disp: , Rfl:  .  simvastatin (ZOCOR) 20 MG tablet, Take 1 tablet (20 mg total) by mouth at bedtime., Disp: 90 tablet, Rfl: 1  No Known Allergies   Review of Systems  Constitutional: Negative for fatigue.  Eyes: Negative for blurred vision.  Respiratory: Negative for shortness of breath.   Cardiovascular: Negative for chest pain and palpitations.  Musculoskeletal: Negative for myalgias.  Neurological: Negative for dizziness and headaches.  Endo/Heme/Allergies: Negative for polydipsia.      Objective  Vitals:   07/23/17 0945  BP: (!) 104/58  Pulse: 68  Resp: 16  Temp: 98.6 F (37 C)  TempSrc: Oral  SpO2: 95%  Weight: 134 lb 11.2 oz (61.1 kg)  Height: 5\' 7"  (1.702 m)    Physical Exam  Constitutional: He is oriented to person, place, and time and well-developed, well-nourished, and in no distress.  HENT:  Head: Normocephalic and atraumatic.  Cardiovascular: Normal rate, regular rhythm and normal heart sounds.   No murmur heard. Pulmonary/Chest: Effort normal and breath sounds normal. He has no wheezes.  Abdominal: Soft. Bowel sounds are normal. There is no tenderness.  Musculoskeletal: He exhibits no edema.  Neurological: He is alert and oriented to person, place, and time.  Psychiatric: Mood, memory, affect and judgment normal.  Nursing note and vitals reviewed.    Recent Results (from the past 2160 hour(s))  POCT Glucose (CBG)     Status: Normal   Collection Time: 07/23/17  9:49 AM  Result Value Ref Range   POC Glucose 82 70 - 99 mg/dl  POCT HgB [id number]     Status: Abnormal   Collection Time: 07/23/17  9:51 AM  Result Value Ref Range   Hemoglobin A1C 6.3      Assessment & Plan  1. Type 2 diabetes mellitus without complication, with long-term current use of insulin (HCC) A1c at goal at 6.3%, no change in pharmacotherapy, reassess in 3 months - POCT HgB A1C - POCT Glucose (CBG) - Urine Microalbumin w/creat. ratio  2.  Dyslipidemia  - Lipid panel - COMPLETE METABOLIC PANEL WITH GFR  3. Essential hypertension  - lisinopril (PRINIVIL,ZESTRIL) 10 MG tablet; Take 1 tablet (10 mg total) by mouth daily.  Dispense: 90 tablet; Refill: 0  Rosetta Rupnow Asad A. Faylene KurtzShah [facility] 07/23/2017 9:58 AM

## 2017-07-24 LAB — MICROALBUMIN / CREATININE URINE RATIO
Creatinine, Urine: 138 mg/dL (ref 20–320)
Microalb Creat Ratio: 4 mcg/mg creat (ref <30)
Microalb, Ur: 0.5 mg/dL

## 2017-09-03 ENCOUNTER — Telehealth: Payer: Self-pay | Admitting: Family Medicine

## 2017-09-03 ENCOUNTER — Other Ambulatory Visit: Payer: Self-pay | Admitting: Family Medicine

## 2017-09-03 DIAGNOSIS — Z794 Long term (current) use of insulin: Principal | ICD-10-CM

## 2017-09-03 DIAGNOSIS — E119 Type 2 diabetes mellitus without complications: Secondary | ICD-10-CM

## 2017-09-03 NOTE — Telephone Encounter (Signed)
Rx refill quest has been sent to Dr. Sherryll BurgerShah for review

## 2017-09-03 NOTE — Telephone Encounter (Signed)
Copied from CRM [id number]. Topic: Quick Communication - See Telephone Encounter >> Sep 03, 2017 10:14 AM Elliot GaultBell, Tiffany M wrote: CRM for notification. See Telephone encounter for:   09/03/17.  Relation to pt: self  Call back number:[phone] [facility] [address] [place] [facility] [id number] [facility]  Reason for call:  Patient requesting  metFORMIN (GLUCOPHAGE) 1000 MG tablet refill, patient did not speak with pharmacy, advised in the future always contact pharmacy for refill, please advise

## 2017-09-05 ENCOUNTER — Other Ambulatory Visit: Payer: Self-pay | Admitting: Family Medicine

## 2017-09-05 DIAGNOSIS — Z794 Long term (current) use of insulin: Principal | ICD-10-CM

## 2017-09-05 DIAGNOSIS — E119 Type 2 diabetes mellitus without complications: Secondary | ICD-10-CM

## 2017-10-23 ENCOUNTER — Ambulatory Visit: Payer: PPO | Admitting: Family Medicine

## 2017-11-05 ENCOUNTER — Ambulatory Visit (INDEPENDENT_AMBULATORY_CARE_PROVIDER_SITE_OTHER): Payer: PPO | Admitting: Family Medicine

## 2017-11-05 ENCOUNTER — Encounter: Payer: Self-pay | Admitting: Family Medicine

## 2017-11-05 VITALS — BP 114/60 | HR 64 | Temp 98.2°F | Resp 16 | Wt 142.5 lb

## 2017-11-05 DIAGNOSIS — I10 Essential (primary) hypertension: Secondary | ICD-10-CM | POA: Diagnosis not present

## 2017-11-05 DIAGNOSIS — E785 Hyperlipidemia, unspecified: Secondary | ICD-10-CM

## 2017-11-05 DIAGNOSIS — E119 Type 2 diabetes mellitus without complications: Secondary | ICD-10-CM | POA: Diagnosis not present

## 2017-11-05 DIAGNOSIS — Z794 Long term (current) use of insulin: Secondary | ICD-10-CM

## 2017-11-05 LAB — COMPLETE METABOLIC PANEL WITH GFR
AG RATIO: 1.6 (calc) (ref 1.0–2.5)
ALT: 15 U/L (ref 9–46)
AST: 18 U/L (ref 10–35)
Albumin: 4.2 g/dL (ref 3.6–5.1)
Alkaline phosphatase (APISO): 46 U/L (ref 40–115)
BUN/Creatinine Ratio: 15 (calc) (ref 6–22)
BUN: 18 mg/dL (ref 7–25)
CALCIUM: 9.3 mg/dL (ref 8.6–10.3)
CO2: 31 mmol/L (ref 20–32)
CREATININE: 1.19 mg/dL — AB (ref 0.70–1.18)
Chloride: 104 mmol/L (ref 98–110)
GFR, EST NON AFRICAN AMERICAN: 62 mL/min/{1.73_m2} (ref >=60)
GFR, Est African American: 71 mL/min/{1.73_m2} (ref >=60)
GLUCOSE: 65 mg/dL (ref 65–139)
Globulin: 2.6 g/dL (calc) (ref 1.9–3.7)
POTASSIUM: 4.2 mmol/L (ref 3.5–5.3)
Sodium: 140 mmol/L (ref 135–146)
Total Bilirubin: 0.5 mg/dL (ref 0.2–1.2)
Total Protein: 6.8 g/dL (ref 6.1–8.1)

## 2017-11-05 LAB — LIPID PANEL
CHOL/HDL RATIO: 2.2 (calc) (ref <5.0)
Cholesterol: 130 mg/dL (ref <200)
HDL: 60 mg/dL (ref >40)
LDL Cholesterol (Calc): 58 mg/dL (calc)
Non-HDL Cholesterol (Calc): 70 mg/dL (calc) (ref <130)
Triglycerides: 50 mg/dL (ref <150)

## 2017-11-05 LAB — GLUCOSE, POCT (MANUAL RESULT ENTRY): POC Glucose: 75 mg/dl (ref 70–99)

## 2017-11-05 LAB — POCT GLYCOSYLATED HEMOGLOBIN (HGB A1C): HEMOGLOBIN A1C: 6.5

## 2017-11-05 MED ORDER — BD PEN NEEDLE SHORT U/F 31G X 8 MM MISC: 1 refills | Status: AC

## 2017-11-05 MED ORDER — METFORMIN HCL 1000 MG PO TABS: 1000.0000 mg | ORAL_TABLET | Freq: Two times a day (BID) | ORAL | 0 refills | Status: DC

## 2017-11-05 MED ORDER — LISINOPRIL 10 MG PO TABS: 10.0000 mg | ORAL_TABLET | Freq: Every day | ORAL | 0 refills | Status: DC

## 2017-11-05 MED ORDER — SIMVASTATIN 20 MG PO TABS: 20.0000 mg | ORAL_TABLET | Freq: Every day | ORAL | 1 refills | Status: DC

## 2017-11-05 NOTE — Progress Notes (Signed)
Name: Larry Robinson   MRN: [id number]    DOB: 14-Aug-1946   Date:11/05/2017       Progress Note  Subjective  Chief Complaint  Chief Complaint  Patient presents with  . Follow-up  . Diabetes  . Hypertension  . Hyperlipidemia    Diabetes  He presents for his follow-up diabetic visit. He has type 2 diabetes mellitus. His disease course has been stable. Pertinent negatives for hypoglycemia include no dizziness, headaches, speech difficulty or sweats. Associated symptoms include polyuria. Pertinent negatives for diabetes include no blurred vision, no chest pain, no fatigue, no foot paresthesias and no polydipsia. Symptoms are stable. Pertinent negatives for diabetic complications include no CVA, heart disease or peripheral neuropathy. Current diabetic treatment includes intensive insulin program and oral agent (monotherapy). He is following a generally healthy and diabetic diet. He rarely participates in exercise. He monitors blood glucose at home 3-4 x per week. His breakfast blood glucose range is generally 130-140 mg/dl. An ACE inhibitor/angiotensin II receptor blocker is being taken. Eye exam is current.  Hypertension  This is a chronic problem. The problem is unchanged. The problem is controlled. Pertinent negatives include no blurred vision, chest pain, headaches, palpitations, shortness of breath or sweats. Past treatments include ACE inhibitors. There is no history of kidney disease, CAD/MI or CVA.  Hyperlipidemia  This is a chronic problem. The problem is controlled. Recent lipid tests were reviewed and are normal. Pertinent negatives include no chest pain, leg pain, myalgias or shortness of breath. Current antihyperlipidemic treatment includes statins.    Past Medical History:  Diagnosis Date  . Diabetes mellitus without complication (HCC)   . Hyperlipidemia   . Hypertension     Past Surgical History:  Procedure Laterality Date  . COLONOSCOPY WITH PROPOFOL N/A 11/07/2016   Procedure: COLONOSCOPY WITH PROPOFOL;  Surgeon: Wyline Mood, MD;  Location: [facility] ENDOSCOPY;  Service: Endoscopy;  Laterality: N/A;  . COLONOSCOPY WITH PROPOFOL N/A 12/26/2016   Procedure: COLONOSCOPY WITH PROPOFOL;  Surgeon: Wyline Mood, MD;  Location: [facility] ENDOSCOPY;  Service: Endoscopy;  Laterality: N/A;  . FOOT SURGERY Right     Family History  Problem Relation Age of Onset  . Hypertension Mother   . Diabetes Father     Social History   Socioeconomic History  . Marital status: Single    Spouse name: Not on file  . Number of children: Not on file  . Years of education: Not on file  . Highest education level: Not on file  Social Needs  . Financial resource strain: Not on file  . Food insecurity - worry: Not on file  . Food insecurity - inability: Not on file  . Transportation needs - medical: Not on file  . Transportation needs - non-medical: Not on file  Occupational History  . Not on file  Tobacco Use  . Smoking status: Current Every Day Smoker    Packs/day: 0.50    Types: Cigarettes  . Smokeless tobacco: Never Used  Substance and Sexual Activity  . Alcohol use: Yes    Alcohol/week: 0.6 oz    Types: 1 Glasses of wine per week    Comment: occasional- wine  . Drug use: No  . Sexual activity: Not Currently  Other Topics Concern  . Not on file  Social History Narrative  . Not on file     Current Outpatient Medications:  .  insulin aspart (NOVOLOG FLEXPEN) 100 UNIT/ML FlexPen, inject 4 units subcutaneously twice a day with meals, Disp: 15 mL,  Rfl: 2 .  Insulin Glargine (LANTUS SOLOSTAR) 100 UNIT/ML Solostar Pen, inject 12 units subcutaneously once daily AT 10PM, Disp: 5 pen, Rfl: 2 .  lisinopril (PRINIVIL,ZESTRIL) 10 MG tablet, Take 1 tablet (10 mg total) by mouth daily., Disp: 90 tablet, Rfl: 0 .  metFORMIN (GLUCOPHAGE) 1000 MG tablet, take 1 tablet by mouth twice a day with meals, Disp: 180 tablet, Rfl: 0 .  Multiple Vitamin (MULTIVITAMIN) tablet, Take 1 tablet by  mouth daily., Disp: , Rfl:  .  simvastatin (ZOCOR) 20 MG tablet, Take 1 tablet (20 mg total) by mouth at bedtime., Disp: 90 tablet, Rfl: 1 .  B-D ULTRAFINE III SHORT PEN 31G X 8 MM MISC, TEST BLOOD SUGAR three times a day, Disp: , Rfl: 1 .  BD ULTRA-FINE LANCETS lancets, Use as instructed to check blood glucose three times daily., Disp: 100 each, Rfl: 12  No Known Allergies   Review of Systems  Constitutional: Negative for fatigue.  Eyes: Negative for blurred vision.  Respiratory: Negative for shortness of breath.   Cardiovascular: Negative for chest pain and palpitations.  Musculoskeletal: Negative for myalgias.  Neurological: Negative for dizziness, speech difficulty and headaches.  Endo/Heme/Allergies: Negative for polydipsia.     Objective  Vitals:   11/05/17 0938  BP: 114/60  Pulse: 64  Resp: 16  Temp: 98.2 F (36.8 C)  TempSrc: Oral  SpO2: 94%  Weight: 142 lb 8 oz (64.6 kg)    Physical Exam  Constitutional: He is oriented to person, place, and time and well-developed, well-nourished, and in no distress.  HENT:  Head: Normocephalic and atraumatic.  Cardiovascular: Normal rate, regular rhythm, S1 normal and S2 normal.  No murmur heard. Pulmonary/Chest: Effort normal and breath sounds normal. He has no wheezes. He has no rhonchi.  Abdominal: Soft. Bowel sounds are normal. There is no tenderness.  Musculoskeletal: He exhibits no edema.       Right ankle: He exhibits no swelling.       Left ankle: He exhibits no swelling.  Neurological: He is alert and oriented to person, place, and time.  Psychiatric: Mood, memory, affect and judgment normal.  Nursing note and vitals reviewed.      Recent Results (from the past 2160 hour(s))  POCT Glucose (CBG)     Status: Normal   Collection Time: 11/05/17  9:38 AM  Result Value Ref Range   POC Glucose 75 70 - 99 mg/dl  POCT HgB [id number]     Status: Normal   Collection Time: 11/05/17  9:45 AM  Result Value Ref Range    Hemoglobin A1C 6.5      Assessment & Plan  1. Type 2 diabetes mellitus without complication, with long-term current use of insulin (HCC) Hemoglobin A1c of 6.5%, well-controlled diabetes - POCT HgB A1C - POCT Glucose (CBG) - metFORMIN (GLUCOPHAGE) 1000 MG tablet; Take 1 tablet (1,000 mg total) by mouth 2 (two) times daily with a meal.  Dispense: 180 tablet; Refill: 0 - B-D ULTRAFINE III SHORT PEN 31G X 8 MM MISC; TEST BLOOD SUGAR once a day  Dispense: 90 each; Refill: 1  2. Dyslipidemia  - simvastatin (ZOCOR) 20 MG tablet; Take 1 tablet (20 mg total) by mouth at bedtime.  Dispense: 90 tablet; Refill: 1 - Lipid panel - COMPLETE METABOLIC PANEL WITH GFR  3. Essential hypertension  - lisinopril (PRINIVIL,ZESTRIL) 10 MG tablet; Take 1 tablet (10 mg total) by mouth daily.  Dispense: 90 tablet; Refill: 0   Carrick Rijos Asad A. Faylene Kurtz  [facility] 11/05/2017 10:09 AM

## 2017-11-07 ENCOUNTER — Other Ambulatory Visit: Payer: Self-pay

## 2017-11-07 ENCOUNTER — Encounter: Payer: Self-pay | Admitting: Emergency Medicine

## 2017-11-07 ENCOUNTER — Emergency Department
Admission: EM | Admit: 2017-11-07 | Discharge: 2017-11-07 | Disposition: A | Discharge status: Home / Self Care | Payer: No Typology Code available for payment source | Attending: Emergency Medicine | Admitting: Emergency Medicine

## 2017-11-07 DIAGNOSIS — F17210 Nicotine dependence, cigarettes, uncomplicated: Secondary | ICD-10-CM | POA: Insufficient documentation

## 2017-11-07 DIAGNOSIS — S0181XA Laceration without foreign body of other part of head, initial encounter: Secondary | ICD-10-CM | POA: Diagnosis not present

## 2017-11-07 DIAGNOSIS — R1010 Upper abdominal pain, unspecified: Secondary | ICD-10-CM | POA: Diagnosis not present

## 2017-11-07 DIAGNOSIS — Z041 Encounter for examination and observation following transport accident: Secondary | ICD-10-CM | POA: Diagnosis present

## 2017-11-07 DIAGNOSIS — E119 Type 2 diabetes mellitus without complications: Secondary | ICD-10-CM | POA: Diagnosis not present

## 2017-11-07 DIAGNOSIS — Z79899 Other long term (current) drug therapy: Secondary | ICD-10-CM | POA: Diagnosis not present

## 2017-11-07 DIAGNOSIS — Z794 Long term (current) use of insulin: Secondary | ICD-10-CM | POA: Insufficient documentation

## 2017-11-07 DIAGNOSIS — I10 Essential (primary) hypertension: Secondary | ICD-10-CM | POA: Insufficient documentation

## 2017-11-07 MED ORDER — ASPIRIN EC 325 MG PO TBEC
650.0000 mg | DELAYED_RELEASE_TABLET | Freq: Once | ORAL | Status: AC
  Administered 2017-11-07: 650 mg via ORAL
  Filled 2017-11-07: qty 2

## 2017-11-07 MED ORDER — ASPIRIN EC 325 MG PO TBEC
DELAYED_RELEASE_TABLET | ORAL | Status: AC
  Filled 2017-11-07: qty 2
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
## 2017-11-10 ENCOUNTER — Other Ambulatory Visit: Payer: Self-pay

## 2017-12-05 DIAGNOSIS — H25043 Posterior subcapsular polar age-related cataract, bilateral: Secondary | ICD-10-CM | POA: Diagnosis not present

## 2017-12-05 DIAGNOSIS — H25019 Cortical age-related cataract, unspecified eye: Secondary | ICD-10-CM | POA: Diagnosis not present

## 2017-12-05 DIAGNOSIS — E113293 Type 2 diabetes mellitus with mild nonproliferative diabetic retinopathy without macular edema, bilateral: Secondary | ICD-10-CM | POA: Diagnosis not present

## 2017-12-05 LAB — HM DIABETES EYE EXAM

## 2017-12-10 ENCOUNTER — Telehealth: Payer: Self-pay | Admitting: Family Medicine

## 2017-12-10 NOTE — Telephone Encounter (Signed)
Copied from CRM [id number]. Topic: Quick Communication - Rx Refill/Question >> Dec 10, 2017  1:36 PM Oneal GroutSebastian, Jennifer S wrote: Medication: metFORMIN (GLUCOPHAGE) 1000 MG tablet    Has the patient contacted their pharmacy? Yes.     (Agent: If no, request that the patient contact the pharmacy for the refill.)   Preferred Pharmacy (with phone number or street name): [facility] on [address]    Agent: Please be advised that RX refills may take up to 3 business days. We ask that you follow-up with your pharmacy.

## 2017-12-11 ENCOUNTER — Encounter: Payer: Self-pay | Admitting: Family Medicine

## 2017-12-12 ENCOUNTER — Other Ambulatory Visit: Payer: Self-pay

## 2017-12-12 DIAGNOSIS — E119 Type 2 diabetes mellitus without complications: Secondary | ICD-10-CM

## 2017-12-12 DIAGNOSIS — Z794 Long term (current) use of insulin: Principal | ICD-10-CM

## 2017-12-12 MED ORDER — METFORMIN HCL 1000 MG PO TABS: 1000.0000 mg | ORAL_TABLET | Freq: Two times a day (BID) | ORAL | 1 refills | Status: DC

## 2017-12-12 NOTE — Telephone Encounter (Signed)
Refill request for diabetic medication:   Metformin 1000 mg  Last office visit pertaining to diabetes: 11/05/2017   Lab Results  Component Value Date   HGBA1C 6.5 11/05/2017    Follow-ups on file. 02/06/2018

## 2017-12-12 NOTE — Telephone Encounter (Signed)
Medication has been tee'd up and waiting to be approved by PCP.

## 2018-02-06 ENCOUNTER — Ambulatory Visit (INDEPENDENT_AMBULATORY_CARE_PROVIDER_SITE_OTHER): Payer: PPO | Admitting: Family Medicine

## 2018-02-06 ENCOUNTER — Encounter: Payer: Self-pay | Admitting: Family Medicine

## 2018-02-06 VITALS — BP 120/70 | HR 59 | Temp 98.0°F | Resp 16 | Ht 67.0 in | Wt 137.3 lb

## 2018-02-06 DIAGNOSIS — D582 Other hemoglobinopathies: Secondary | ICD-10-CM

## 2018-02-06 DIAGNOSIS — N528 Other male erectile dysfunction: Secondary | ICD-10-CM

## 2018-02-06 DIAGNOSIS — E1140 Type 2 diabetes mellitus with diabetic neuropathy, unspecified: Secondary | ICD-10-CM | POA: Diagnosis not present

## 2018-02-06 DIAGNOSIS — N521 Erectile dysfunction due to diseases classified elsewhere: Secondary | ICD-10-CM | POA: Diagnosis not present

## 2018-02-06 DIAGNOSIS — Z1159 Encounter for screening for other viral diseases: Secondary | ICD-10-CM | POA: Diagnosis not present

## 2018-02-06 DIAGNOSIS — E162 Hypoglycemia, unspecified: Secondary | ICD-10-CM

## 2018-02-06 DIAGNOSIS — Z23 Encounter for immunization: Secondary | ICD-10-CM | POA: Diagnosis not present

## 2018-02-06 DIAGNOSIS — R350 Frequency of micturition: Secondary | ICD-10-CM

## 2018-02-06 LAB — POCT URINALYSIS DIPSTICK
BILIRUBIN UA: NEGATIVE
Blood, UA: NEGATIVE
Glucose, UA: NEGATIVE
Ketones, UA: NEGATIVE
Leukocytes, UA: NEGATIVE
Nitrite, UA: NEGATIVE
PH UA: 5 (ref 5.0–8.0)
Spec Grav, UA: 1.015 (ref 1.010–1.025)
Urobilinogen, UA: 0.2 E.U./dL

## 2018-02-06 LAB — POCT GLYCOSYLATED HEMOGLOBIN (HGB A1C): Hemoglobin A1C: >14

## 2018-02-06 LAB — POCT GLUCOSE (DEVICE FOR HOME USE)
Glucose Fasting, POC: 54 mg/dL — AB (ref 70–99)
POC Glucose: 150 mg/dl — AB (ref 70–99)

## 2018-02-06 MED ORDER — DULAGLUTIDE 1.5 MG/0.5ML ~~LOC~~ SOAJ: 1.5000 mg | SUBCUTANEOUS | 2 refills | Status: DC

## 2018-02-06 NOTE — Progress Notes (Signed)
Name: Larry Robinson   MRN: [id number]    DOB: 08-17-1946   Date:02/06/2018       Progress Note  Subjective  Chief Complaint  Chief Complaint  Patient presents with  . Diabetes  . Hypertension  . Hyperlipidemia  . Medication Refill    3 month F/U  . Urinary Frequency    complains of frequent urination    HPI   DMII: denies polyphagia or polydipsia, he has urinary frequency. He is on pre-meal insulin 4 units twice daily and lantus 9 units plus Metformin, hgbA1C has been at goal, he came in today and glucose was 54 - no symptoms. He checks sugar very seldom and he likely has been having hypoglycemia frequently. We will stop pre-meal insulin and switch to trulicity and metformin, hgbA1C to be done at lab since hgb was too high and we will check CBC - he is a smoker   Urinary frequency: going on for two years, with nocturia and dribbling. He states it is getting worse, no burning with voiding, or blood in urine.   IPSS Questionnaire (AUA-7): Over the past month.   1)  How often have you had a sensation of not emptying your bladder completely after you finish urinating?  5 - Almost always  2)  How often have you had to urinate again less than two hours after you finished urinating? 1 - Less than 1 time in 5  3)  How often have you found you stopped and started again several times when you urinated?  5 - Almost always  4) How difficult have you found it to postpone urination?  3 - About half the time  5) How often have you had a weak urinary stream?  5 - Almost always  6) How often have you had to push or strain to begin urination?  5 - Almost always  7) How many times did you most typically get up to urinate from the time you went to bed until the time you got up in the morning?  3 - 3 times  Total score:  0-7 mildly symptomatic   8-19 moderately symptomatic   20-35 severely symptomatic    Patient Active Problem List   Diagnosis Date Noted  . Diabetes mellitus with neuropathy  causing erectile dysfunction (HCC) 08/15/2015  . At risk for falling 06/14/2015  . Hypertension 05/17/2015  . Dyslipidemia 05/17/2015  . Current smoker 05/17/2015    Past Surgical History:  Procedure Laterality Date  . COLONOSCOPY WITH PROPOFOL N/A 11/07/2016   Procedure: COLONOSCOPY WITH PROPOFOL;  Surgeon: Wyline Mood, MD;  Location: [facility] ENDOSCOPY;  Service: Endoscopy;  Laterality: N/A;  . COLONOSCOPY WITH PROPOFOL N/A 12/26/2016   Procedure: COLONOSCOPY WITH PROPOFOL;  Surgeon: Wyline Mood, MD;  Location: [facility] ENDOSCOPY;  Service: Endoscopy;  Laterality: N/A;  . FOOT SURGERY Right     Family History  Problem Relation Age of Onset  . Hypertension Mother   . Diabetes Father     Social History   Socioeconomic History  . Marital status: Single    Spouse name: Not on file  . Number of children: Not on file  . Years of education: Not on file  . Highest education level: Not on file  Occupational History  . Not on file  Social Needs  . Financial resource strain: Not on file  . Food insecurity:    Worry: Not on file    Inability: Not on file  . Transportation needs:  Medical: Not on file    Non-medical: Not on file  Tobacco Use  . Smoking status: Current Every Day Smoker    Packs/day: 0.50    Types: Cigarettes    Start date: 02/07/1967  . Smokeless tobacco: Never Used  Substance and Sexual Activity  . Alcohol use: Yes    Alcohol/week: 0.6 oz    Types: 1 Glasses of wine per week    Comment: occasional- wine  . Drug use: No  . Sexual activity: Not Currently  Lifestyle  . Physical activity:    Days per week: Not on file    Minutes per session: Not on file  . Stress: Not on file  Relationships  . Social connections:    Talks on phone: Not on file    Gets together: Not on file    Attends religious service: Not on file    Active member of club or organization: Not on file    Attends meetings of clubs or organizations: Not on file    Relationship status: Not on file   . Intimate partner violence:    Fear of current or ex partner: Not on file    Emotionally abused: Not on file    Physically abused: Not on file    Forced sexual activity: Not on file  Other Topics Concern  . Not on file  Social History Narrative  . Not on file     Current Outpatient Medications:  .  B-D ULTRAFINE III SHORT PEN 31G X 8 MM MISC, TEST BLOOD SUGAR once a day, Disp: 90 each, Rfl: 1 .  BD ULTRA-FINE LANCETS lancets, Use as instructed to check blood glucose three times daily., Disp: 100 each, Rfl: 12 .  lisinopril (PRINIVIL,ZESTRIL) 10 MG tablet, Take 1 tablet (10 mg total) by mouth daily., Disp: 90 tablet, Rfl: 0 .  metFORMIN (GLUCOPHAGE) 1000 MG tablet, Take 1 tablet (1,000 mg total) by mouth 2 (two) times daily with a meal., Disp: 180 tablet, Rfl: 1 .  Multiple Vitamin (MULTIVITAMIN) tablet, Take 1 tablet by mouth daily., Disp: , Rfl:  .  simvastatin (ZOCOR) 20 MG tablet, Take 1 tablet (20 mg total) by mouth at bedtime., Disp: 90 tablet, Rfl: 1 .  Dulaglutide (TRULICITY) 1.5 MG/0.5ML SOPN, Inject 1.5 mg into the skin once a week., Disp: 4 pen, Rfl: 2  No Known Allergies   ROS  Constitutional: Negative for fever or weight change.  Respiratory: Negative for cough and shortness of breath.   Cardiovascular: Negative for chest pain or palpitations.  Gastrointestinal: Negative for abdominal pain, no bowel changes.  Musculoskeletal: Negative for gait problem or joint swelling.  Skin: Negative for rash.  Neurological: Negative for dizziness or headache.  No other specific complaints in a complete review of systems (except as listed in HPI above).  Objective  Vitals:   02/06/18 0924  BP: 120/70  Pulse: (!) 59  Resp: 16  Temp: 98 F (36.7 C)  TempSrc: Oral  SpO2: 98%  Weight: 137 lb 4.8 oz (62.3 kg)  Height:  (1.702 m)    Body mass index is 21.5 kg/m.  Physical Exam  Constitutional: Patient appears well-developed and well-nourished.  No distress.   HEENT: head atraumatic, normocephalic, pupils equal and reactive to light, neck supple, throat within normal limits Cardiovascular: Normal rate, regular rhythm and normal heart sounds.  No murmur heard. No BLE edema. Pulmonary/Chest: Effort normal and breath sounds normal. No respiratory distress. Abdominal: Soft.  There is no tenderness. Psychiatric: Patient  has a normal mood and affect. behavior is normal. Judgment and thought content normal.  Recent Results (from the past 2160 hour(s))  HM DIABETES EYE EXAM     Status: Abnormal   Collection Time: 12/05/17 12:00 AM  Result Value Ref Range   HM Diabetic Eye Exam Retinopathy (A) No Retinopathy    Comment: [facility]- Requires monitoring-Dr. Clydene Pugh  POCT Urinalysis Dipstick     Status: None   Collection Time: 02/06/18  9:48 AM  Result Value Ref Range   Color, UA yellow    Clarity, UA clear    Glucose, UA negative    Bilirubin, UA negative    Ketones, UA negative    Spec Grav, UA 1.015 1.010 - 1.025   Blood, UA negative    pH, UA 5.0 5.0 - 8.0   Protein, UA trace    Urobilinogen, UA 0.2 0.2 or 1.0 E.U./dL   Nitrite, UA negative    Leukocytes, UA Negative Negative   Appearance good    Odor yes   POCT Glucose (Device for Home Use)     Status: Abnormal   Collection Time: 02/06/18  9:55 AM  Result Value Ref Range   Glucose Fasting, POC 54 (A) 70 - 99 mg/dL   POC Glucose  70 - 99 mg/dl  POCT HgB W0J     Status: Abnormal   Collection Time: 02/06/18  9:56 AM  Result Value Ref Range   Hemoglobin A1C >14.0%     Comment: high total hemoglobin, to high to read  POCT Glucose (Device for Home Use)     Status: Abnormal   Collection Time: 02/06/18 11:49 AM  Result Value Ref Range   Glucose Fasting, POC  70 - 99 mg/dL   POC Glucose 811 (A) 70 - 99 mg/dl      BJY7/8: Depression screen [facility] 2/9 11/05/2017 07/23/2017 07/07/2017 04/22/2017 01/02/2017  Decreased Interest 0 0 0 0 0  Down, Depressed, Hopeless 0 0 0 0 0  PHQ - 2 Score  0 0 0 0 0     Fall Risk: Fall Risk  02/06/2018 11/05/2017 07/23/2017 07/07/2017 04/22/2017  Falls in the past year? No No No No No     Functional Status Survey: Is the patient deaf or have difficulty hearing?: No Does the patient have difficulty seeing, even when wearing glasses/contacts?: No Does the patient have difficulty concentrating, remembering, or making decisions?: No Does the patient have difficulty walking or climbing stairs?: No Does the patient have difficulty dressing or bathing?: No Does the patient have difficulty doing errands alone such as visiting a doctor's office or shopping?: No    Assessment & Plan  1. Type 2 diabetes mellitus with neuropathy causing erectile dysfunction (HCC)  He just filled rx of Lantus, so he will go down to 5 units daily until he sees me back, after that he will get trulicity to take once a week, stop pre-meal insulin  - POCT HgB A1C - not able to read - hgb too high  - POCT Glucose (Device for Home Use)  - Hemoglobin A1c - Dulaglutide (TRULICITY) 1.5 MG/0.5ML SOPN; Inject 1.5 mg into the skin once a week.  Dispense: 4 pen; Refill: 2  2. Frequent urination  - POCT Urinalysis Dipstick - PSA - Ambulatory referral to Urology  3. Need for 23-polyvalent pneumococcal polysaccharide vaccine  - Pneumococcal polysaccharide vaccine 23-valent greater than or equal to 2yo subcutaneous/IM  4. Need for hepatitis C screening test  - Hepatitis C  Antibody  5. Urinary frequency   6. Other male erectile dysfunction   7. Hypoglycemia  When he first arrived glucose was 54 but before he left it was up to 150 after a snack in our office  8. Elevated hemoglobin (HCC)  - CBC with Differential/Platelet

## 2018-02-07 LAB — HEMOGLOBIN A1C
EAG (MMOL/L): 7.9 (calc)
Hgb A1c MFr Bld: 6.6 % of total Hgb — ABNORMAL HIGH (ref <5.7)
Mean Plasma Glucose: 143 (calc)

## 2018-02-07 LAB — HEPATITIS C ANTIBODY
HEP C AB: NONREACTIVE
SIGNAL TO CUT-OFF: 0.07 (ref <1.00)

## 2018-02-07 LAB — PSA: PSA: 1 ng/mL (ref <=4.0)

## 2018-02-12 LAB — CBC WITH DIFFERENTIAL/PLATELET
Basophils Absolute: 11 cells/uL (ref 0–200)
Basophils Relative: 0.2 %
Eosinophils Absolute: 130 cells/uL (ref 15–500)
Eosinophils Relative: 2.4 %
HCT: 47 % (ref 38.5–50.0)
Hemoglobin: 16.2 g/dL (ref 13.2–17.1)
Lymphs Abs: 1361 cells/uL (ref 850–3900)
MCH: 32 pg (ref 27.0–33.0)
MCHC: 34.5 g/dL (ref 32.0–36.0)
MCV: 92.7 fL (ref 80.0–100.0)
MPV: 10.5 fL (ref 7.5–12.5)
Monocytes Relative: 6.4 %
Neutro Abs: 3553 cells/uL (ref 1500–7800)
Neutrophils Relative %: 65.8 %
PLATELETS: 191 10*3/uL (ref 140–400)
RBC: 5.07 10*6/uL (ref 4.20–5.80)
RDW: 12.6 % (ref 11.0–15.0)
TOTAL LYMPHOCYTE: 25.2 %
WBC: 5.4 10*3/uL (ref 3.8–10.8)
WBCMIX: 346 {cells}/uL (ref 200–950)

## 2018-03-09 ENCOUNTER — Ambulatory Visit: Payer: PPO | Admitting: Nurse Practitioner

## 2018-03-16 ENCOUNTER — Encounter: Payer: Self-pay | Admitting: Urology

## 2018-03-16 ENCOUNTER — Ambulatory Visit: Payer: PPO | Admitting: Urology

## 2018-03-16 VITALS — BP 129/67 | HR 65 | Ht 67.0 in | Wt 138.4 lb

## 2018-03-16 DIAGNOSIS — R350 Frequency of micturition: Secondary | ICD-10-CM

## 2018-03-16 LAB — URINALYSIS, COMPLETE
Bilirubin, UA: NEGATIVE
Glucose, UA: NEGATIVE
Ketones, UA: NEGATIVE
LEUKOCYTES UA: NEGATIVE
NITRITE UA: NEGATIVE
PH UA: 5 (ref 5.0–7.5)
PROTEIN UA: NEGATIVE
RBC, UA: NEGATIVE
SPEC GRAV UA: 1.02 (ref 1.005–1.030)
Urobilinogen, Ur: 0.2 mg/dL (ref 0.2–1.0)

## 2018-03-16 LAB — BLADDER SCAN AMB NON-IMAGING: Scan Result: 30

## 2018-03-16 MED ORDER — TAMSULOSIN HCL 0.4 MG PO CAPS: 0.4000 mg | ORAL_CAPSULE | Freq: Every day | ORAL | 11 refills | Status: DC

## 2018-03-16 NOTE — Progress Notes (Signed)
03/16/2018 9:28 AM   Larry Robinson 11/24/1945 [id number]  Referring provider: Alba CorySowles, Krichna, MD [address] [place], [place] [id number]  Chief Complaint  Patient presents with  . Urinary Frequency    HPI: I was consulted to assist the patient's voiding dysfunction.  He has a decreased flow with stopping and starting and he does not feel empty.  He can void 1-4 times a night.  He voids every 2 hours.  He has some urgency but no incontinence.  He is an insulin-dependent diabetic  He denies a history of kidney stones or previous GU surgery.  He does not get bladder infections.  The symptoms have not been medically treated  Modifying factors: There are no other modifying factors  Associated signs and symptoms: There are no other associated signs and symptoms Aggravating and relieving factors: There are no other aggravating or relieving factors Severity: Moderate Duration: Persistent   PMH: Past Medical History:  Diagnosis Date  . Diabetes mellitus without complication (HCC)   . Hyperlipidemia   . Hypertension     Surgical History: Past Surgical History:  Procedure Laterality Date  . COLONOSCOPY WITH PROPOFOL N/A 11/07/2016   Procedure: COLONOSCOPY WITH PROPOFOL;  Surgeon: Wyline MoodKiran Anna, MD;  [facility] ENDOSCOPY;  Service: Endoscopy;  Laterality: N/A;  . COLONOSCOPY WITH PROPOFOL N/A 12/26/2016   Procedure: COLONOSCOPY WITH PROPOFOL;  Surgeon: Wyline MoodKiran Anna, MD;  Location: [facility] ENDOSCOPY;  Service: Endoscopy;  Laterality: N/A;  . FOOT SURGERY Right     Home Medications:  Allergies as of 03/16/2018   No Known Allergies     Medication List        Accurate as of 03/16/18  9:28 AM. Always use your most recent med list.          B-D ULTRAFINE III SHORT PEN 31G X 8 MM Misc Generic drug:  Insulin Pen Needle TEST BLOOD SUGAR once a day   BD ULTRA-FINE LANCETS lancets Use as instructed to check blood glucose three times daily.   Dulaglutide 1.5  MG/0.5ML Sopn Commonly known as:  TRULICITY Inject 1.5 mg into the skin once a week.   lisinopril 10 MG tablet Commonly known as:  PRINIVIL,ZESTRIL Take 1 tablet (10 mg total) by mouth daily.   metFORMIN 1000 MG tablet Commonly known as:  GLUCOPHAGE Take 1 tablet (1,000 mg total) by mouth 2 (two) times daily with a meal.   multivitamin tablet Take 1 tablet by mouth daily.   simvastatin 20 MG tablet Commonly known as:  ZOCOR Take 1 tablet (20 mg total) by mouth at bedtime.       Allergies: No Known Allergies  Family History: Family History  Problem Relation Age of Onset  . Hypertension Mother   . Diabetes Father   . Prostate cancer Neg Hx   . Bladder Cancer Neg Hx   . Kidney cancer Neg Hx     Social History:  reports that he has been smoking cigarettes.  He started smoking about 51 years ago. He has been smoking about 0.50 packs per day. He has never used smokeless tobacco. He reports that he drinks about 0.6 oz of alcohol per week. He reports that he does not use drugs.  ROS: UROLOGY Frequent Urination?: Yes Hard to postpone urination?: Yes Burning/pain with urination?: No Get up at night to urinate?: Yes Leakage of urine?: Yes Urine stream starts and stops?: Yes Trouble starting stream?: Yes Do you have to strain to urinate?: Yes Blood in urine?:  No Urinary tract infection?: No Sexually transmitted disease?: No Injury to kidneys or bladder?: No Painful intercourse?: No Weak stream?: Yes Erection problems?: No Penile pain?: No  Gastrointestinal Nausea?: No Vomiting?: No Indigestion/heartburn?: No Diarrhea?: No Constipation?: Yes  Constitutional Fever: No Night sweats?: No Weight loss?: No Fatigue?: No  Skin Skin rash/lesions?: No Itching?: No  Eyes Blurred vision?: No Double vision?: No  Ears/Nose/Throat Sore throat?: No Sinus problems?: No  Hematologic/Lymphatic Swollen glands?: No Easy bruising?: No  Cardiovascular Leg swelling?:  No Chest pain?: No  Respiratory Cough?: No Shortness of breath?: No  Endocrine Excessive thirst?: Yes  Musculoskeletal Back pain?: No Joint pain?: No  Neurological Headaches?: No Dizziness?: No  Psychologic Depression?: No Anxiety?: No  Physical Exam: BP 129/67 (BP Location: Right Arm, Patient Position: Sitting, Cuff Size: Normal)   Pulse 65   Ht 5\' 7"  (1.702 m)   Wt 138 lb 6.4 oz (62.8 kg)   BMI 21.68 kg/m   Constitutional:  Alert and oriented, No acute distress. HEENT: [place] AT, moist mucus membranes.  Trachea midline, no masses. Cardiovascular: No clubbing, cyanosis, or edema. Respiratory: Normal respiratory effort, no increased work of breathing. GI: Abdomen is soft, nontender, nondistended, no abdominal masses GU: 50 g benign prostate or larger Skin: No rashes, bruises or suspicious lesions. Lymph: No cervical or inguinal adenopathy. Neurologic: Grossly intact, no focal deficits, moving all 4 extremities. Psychiatric: Normal mood and affect.  Laboratory Data: Lab Results  Component Value Date   WBC 5.4 02/06/2018   HGB 16.2 02/06/2018   HCT 47.0 02/06/2018   MCV 92.7 02/06/2018   PLT 191 02/06/2018    Lab Results  Component Value Date   CREATININE 1.19 (H) 11/05/2017    Lab Results  Component Value Date   PSA 1.0 02/06/2018   PSA 1.2 09/30/2016    No results found for: TESTOSTERONE  Lab Results  Component Value Date   HGBA1C 6.6 (H) 02/06/2018    Urinalysis    Component Value Date/Time   BILIRUBINUR negative 02/06/2018 0948   PROTEINUR trace 02/06/2018 0948   UROBILINOGEN 0.2 02/06/2018 0948   NITRITE negative 02/06/2018 0948   LEUKOCYTESUR Negative 02/06/2018 0948    Pertinent Imaging: None  Assessment & Plan: The patient has lower urinary tract symptoms with frequency nocturia and flow symptoms secondary to benign prostatic hyperplasia.  He will be started on Flomax.  Residual today was 30 mL.  He had no blood in the urine  1.  Urinary frequency 2.  BPH - Urinalysis, Complete - Bladder Scan (Post Void Residual) in office   No follow-ups on file.  Martina Sinner, MD  [facility] [address] [place], [place] [address] [phone]

## 2018-03-18 ENCOUNTER — Telehealth: Payer: Self-pay | Admitting: Urology

## 2018-03-18 MED ORDER — TAMSULOSIN HCL 0.4 MG PO CAPS: 0.4000 mg | ORAL_CAPSULE | Freq: Every day | ORAL | 11 refills | Status: DC

## 2018-03-18 NOTE — Telephone Encounter (Signed)
Script resent

## 2018-03-18 NOTE — Telephone Encounter (Signed)
Pt called office asking about his Flomax Rx, states he has called [facility], they tell him they have not received anything from this office. Chart says Rx was faxed, [facility] states they have not received anything.  Please advise. Thanks.

## 2018-03-18 NOTE — Addendum Note (Signed)
Addended by: Martha ClanWATTS, Roby Spalla M on: 03/18/2018 02:52 PM   Modules accepted: Orders

## 2018-03-19 ENCOUNTER — Ambulatory Visit (INDEPENDENT_AMBULATORY_CARE_PROVIDER_SITE_OTHER): Payer: PPO | Admitting: Nurse Practitioner

## 2018-03-19 ENCOUNTER — Encounter: Payer: Self-pay | Admitting: Nurse Practitioner

## 2018-03-19 VITALS — BP 118/68 | HR 67 | Temp 98.7°F | Resp 16 | Ht 67.0 in | Wt 137.0 lb

## 2018-03-19 DIAGNOSIS — E1140 Type 2 diabetes mellitus with diabetic neuropathy, unspecified: Secondary | ICD-10-CM

## 2018-03-19 DIAGNOSIS — Z794 Long term (current) use of insulin: Secondary | ICD-10-CM | POA: Diagnosis not present

## 2018-03-19 DIAGNOSIS — N521 Erectile dysfunction due to diseases classified elsewhere: Secondary | ICD-10-CM

## 2018-03-19 DIAGNOSIS — E119 Type 2 diabetes mellitus without complications: Secondary | ICD-10-CM

## 2018-03-19 LAB — GLUCOSE, POCT (MANUAL RESULT ENTRY): POC GLUCOSE: 97 mg/dL (ref 70–99)

## 2018-03-19 MED ORDER — DULAGLUTIDE 0.75 MG/0.5ML ~~LOC~~ SOAJ: 0.7500 mg | SUBCUTANEOUS | 0 refills | Status: DC

## 2018-03-19 MED ORDER — METFORMIN HCL 1000 MG PO TABS: 1000.0000 mg | ORAL_TABLET | Freq: Two times a day (BID) | ORAL | 1 refills | Status: DC

## 2018-03-19 NOTE — Addendum Note (Signed)
Addended by: Cheryle HorsfallPOULOSE, Lanina Larranaga E on: 03/19/2018 12:36 PM   Modules accepted: Orders

## 2018-03-19 NOTE — Progress Notes (Signed)
Name: Larry Robinson   MRN: [id number]    DOB: 06/15/1946   Date:03/19/2018       Progress Note  Subjective  Chief Complaint  Chief Complaint  Patient presents with  . Follow-up  . Diabetes    Bs running around 120    HPI  Was seen last month in office and noted to be hypoglycemic. Patients insulin was adjusted with the following plan:  He just filled rx of Lantus, so he will go down to 11 units daily until he sees me back, after that he will get trulicity to take once a week, stop pre-meal insulin    Patient states he is currently taking 11 units of lantus. He states is still taking novolin 4 units twice a day before meals. Patient is still taking metformin. Patient states 2 years ago he recalled an episode of hypoglycemia when he was lightheaded but states hasn't had that since.  Patient states has been eating regular meals since last appt but also is not aware when he is hypoglycemic. Doesn't check blood sugars at home states to hard to poke himself.     Patient Active Problem List   Diagnosis Date Noted  . Diabetes mellitus with neuropathy causing erectile dysfunction (HCC) 08/15/2015  . At risk for falling 06/14/2015  . Hypertension 05/17/2015  . Dyslipidemia 05/17/2015  . Current smoker 05/17/2015    Past Medical History:  Diagnosis Date  . Diabetes mellitus without complication (HCC)   . Hyperlipidemia   . Hypertension     Past Surgical History:  Procedure Laterality Date  . COLONOSCOPY WITH PROPOFOL N/A 11/07/2016   Procedure: COLONOSCOPY WITH PROPOFOL;  Surgeon: Wyline Mood, MD;  Location: [facility] ENDOSCOPY;  Service: Endoscopy;  Laterality: N/A;  . COLONOSCOPY WITH PROPOFOL N/A 12/26/2016   Procedure: COLONOSCOPY WITH PROPOFOL;  Surgeon: Wyline Mood, MD;  Location: [facility] ENDOSCOPY;  Service: Endoscopy;  Laterality: N/A;  . FOOT SURGERY Right     Social History   Tobacco Use  . Smoking status: Current Every Day Smoker    Packs/day: 0.50    Types: Cigarettes     Start date: 02/07/1967  . Smokeless tobacco: Never Used  Substance Use Topics  . Alcohol use: Yes    Alcohol/week: 0.6 oz    Types: 1 Glasses of wine per week    Comment: occasional- wine     Current Outpatient Medications:  .  B-D ULTRAFINE III SHORT PEN 31G X 8 MM MISC, TEST BLOOD SUGAR once a day, Disp: 90 each, Rfl: 1 .  BD ULTRA-FINE LANCETS lancets, Use as instructed to check blood glucose three times daily., Disp: 100 each, Rfl: 12 .  Dulaglutide (TRULICITY) 1.5 MG/0.5ML SOPN, Inject 1.5 mg into the skin once a week., Disp: 4 pen, Rfl: 2 .  lisinopril (PRINIVIL,ZESTRIL) 10 MG tablet, Take 1 tablet (10 mg total) by mouth daily., Disp: 90 tablet, Rfl: 0 .  metFORMIN (GLUCOPHAGE) 1000 MG tablet, Take 1 tablet (1,000 mg total) by mouth 2 (two) times daily with a meal., Disp: 180 tablet, Rfl: 1 .  Multiple Vitamin (MULTIVITAMIN) tablet, Take 1 tablet by mouth daily., Disp: , Rfl:  .  simvastatin (ZOCOR) 20 MG tablet, Take 1 tablet (20 mg total) by mouth at bedtime., Disp: 90 tablet, Rfl: 1 .  tamsulosin (FLOMAX) 0.4 MG CAPS capsule, Take 1 capsule (0.4 mg total) by mouth daily., Disp: 30 capsule, Rfl: 11  No Known Allergies  ROS  Constitutional: Negative for fever or weight change.  Respiratory: Negative for cough and shortness of breath.   Cardiovascular: Negative for chest pain or palpitations.  Gastrointestinal: Negative for abdominal pain, no bowel changes.  Musculoskeletal: Negative for gait problem or joint swelling.  Skin: Negative for rash.  Neurological: Negative for dizziness or headache.  No other specific complaints in a complete review of systems (except as listed in HPI above).  Objective  Vitals:   03/19/18 0913  BP: 118/68  Pulse: 67  Resp: 16  Temp: 98.7 F (37.1 C)  TempSrc: Oral  SpO2: 97%  Weight: 137 lb (62.1 kg)  Height: 5\' 7"  (1.702 m)    Body mass index is 21.46 kg/m.  Nursing Note and Vital Signs reviewed.  Physical  Exam   Constitutional: Patient appears well-developed and well-nourished. No distress.  Cardiovascular: Normal rate, regular rhythm, S1/S2 present. Pulmonary/Chest: Effort normal and breath sounds clear.  Psychiatric: Patient has a normal mood and affect. behavior is normal. Judgment and thought content normal.  No results found for this or any previous visit (from the past 72 hour(s)).  Assessment & Plan  1. Type 2 diabetes mellitus with neuropathy causing erectile dysfunction (HCC) Pt agreeable to stop meal time insulin. Cut down lantus to 5 units a day and add on trulicity. Follow up in 2 weeks. Discussed hypoglycemic symptoms. Eat regular meals - Dulaglutide (TRULICITY) 0.75 MG/0.5ML SOPN; Inject 0.75 mg into the skin once a week.  Dispense: 4 pen; Refill: 0 - metFORMIN (GLUCOPHAGE) 1000 MG tablet; Take 1 tablet (1,000 mg total) by mouth 2 (two) times daily with a meal.  Dispense: 180 tablet; Refill: 1  -Red flags and when to present for emergency care or RTC including fever >101.6F, chest pain, shortness of breath, new/worsening/un-resolving symptoms, hypoglycemia unreesolved with snack reviewed with patient at time of visit. Follow up and care instructions discussed and provided in AVS.

## 2018-03-19 NOTE — Patient Instructions (Addendum)
- Stop meal time insulin (Novolin) today.  - Drop down to lantus 5 units  - Pick up trulicity once a week shot 0.30m for 2 weeks then follow-up  - Continue taking metformin.    Hypoglycemia Hypoglycemia is when the sugar (glucose) level in the blood is too low. Symptoms of low blood sugar may include:  Feeling: ? Hungry. ? Worried or nervous (anxious). ? Sweaty and clammy. ? Confused. ? Dizzy. ? Sleepy. ? Sick to your stomach (nauseous).  Having: ? A fast heartbeat. ? A headache. ? A change in your vision. ? Jerky movements that you cannot control (seizure). ? Nightmares. ? Tingling or no feeling (numbness) around the mouth, lips, or tongue.  Having trouble with: ? Talking. ? Paying attention (concentrating). ? Moving (coordination). ? Sleeping.  Shaking.  Passing out (fainting).  Getting upset easily (irritability).  Low blood sugar can happen to people who have diabetes and people who do not have diabetes. Low blood sugar can happen quickly, and it can be an emergency. Treating Low Blood Sugar Low blood sugar is often treated by eating or drinking something sugary right away. If you can think clearly and swallow safely, follow the 15:15 rule:  Take 15 grams of a fast-acting carb (carbohydrate). Some fast-acting carbs are: ? 1 tube of glucose gel. ? 3 sugar tablets (glucose pills). ? 6-8 pieces of hard candy. ? 4 oz (120 mL) of fruit juice. ? 4 oz (120 mL) of regular (not diet) soda.  Check your blood sugar 15 minutes after you take the carb.  If your blood sugar is still at or below 70 mg/dL (3.9 mmol/L), take 15 grams of a carb again.  If your blood sugar does not go above 70 mg/dL (3.9 mmol/L) after 3 tries, get help right away.  After your blood sugar goes back to normal, eat a meal or a snack within 1 hour.  Treating Very Low Blood Sugar If your blood sugar is at or below 54 mg/dL (3 mmol/L), you have very low blood sugar (severe hypoglycemia). This is  an emergency. Do not wait to see if the symptoms will go away. Get medical help right away. Call your local emergency services (911 in the [place] Do not drive yourself to the hospital. If you have very low blood sugar and you cannot eat or drink, you may need a glucagon shot (injection). A family member or friend should learn how to check your blood sugar and how to give you a glucagon shot. Ask your doctor if you need to have a glucagon shot kit at home. Follow these instructions at home: General instructions  Avoid any diets that cause you to not eat enough food. Talk with your doctor before you start any new diet.  Take over-the-counter and prescription medicines only as told by your doctor.  Limit alcohol to no more than 1 drink per day for nonpregnant women and 2 drinks per day for men. One drink equals 12 oz of beer, 5 oz of wine, or 1 oz of hard liquor.  Keep all follow-up visits as told by your doctor. This is important. If You Have Diabetes:   Make sure you know the symptoms of low blood sugar.  Always keep a source of sugar with you, such as: ? Sugar. ? Sugar tablets. ? Glucose gel. ? Fruit juice. ? Regular soda (not diet soda). ? Milk. ? Hard candy. ? Honey.  Take your medicines as told.  Follow your exercise and meal  plan. ? Eat on time. Do not skip meals. ? Follow your sick day plan when you cannot eat or drink normally. Make this plan ahead of time with your doctor.  Check your blood sugar as often as told by your doctor. Always check before and after exercise.  Share your diabetes care plan with: ? Your work or school. ? People you live with.  Check your pee (urine) for ketones: ? When you are sick. ? As told by your doctor.  Carry a card or wear jewelry that says you have diabetes. If You Have Low Blood Sugar From Other Causes:   Check your blood sugar as often as told by your doctor.  Follow instructions from your doctor about what you cannot eat or  drink. Contact a doctor if:  You have trouble keeping your blood sugar in your target range.  You have low blood sugar often. Get help right away if:  You still have symptoms after you eat or drink something sugary.  Your blood sugar is at or below 54 mg/dL (3 mmol/L).  You have jerky movements that you cannot control.  You pass out. These symptoms may be an emergency. Do not wait to see if the symptoms will go away. Get medical help right away. Call your local emergency services (911 in the [place] Do not drive yourself to the hospital. This information is not intended to replace advice given to you by your health care provider. Make sure you discuss any questions you have with your health care provider. Document Released: 12/04/2009 Document Revised: 02/15/2016 Document Reviewed: 10/13/2015 Elsevier Interactive Patient Education  [employer].

## 2018-03-20 ENCOUNTER — Other Ambulatory Visit: Payer: Self-pay

## 2018-03-20 ENCOUNTER — Telehealth: Payer: Self-pay | Admitting: Family Medicine

## 2018-03-20 ENCOUNTER — Telehealth: Payer: Self-pay | Admitting: Urology

## 2018-03-20 DIAGNOSIS — Z794 Long term (current) use of insulin: Principal | ICD-10-CM

## 2018-03-20 DIAGNOSIS — E119 Type 2 diabetes mellitus without complications: Secondary | ICD-10-CM

## 2018-03-20 NOTE — Telephone Encounter (Signed)
Copied from CRM [id number]. Topic: Quick Communication - Rx Refill/Question >> Mar 20, 2018 10:56 AM Larry Robinson, Larry Robinson wrote: Medication: metFORMIN (GLUCOPHAGE) 1000 MG tablet  Has the patient contacted their pharmacy? Yes.  He went to pharmacy today (Agent: If no, request that the patient contact the pharmacy for the refill.) (Agent: If yes, when and what did the pharmacy advise?)  Preferred Pharmacy (with phone number or street name): [facility] [address] [place] [phone] (Phone) [phone] (Fax)      Agent: Please be advised that RX refills may take up to 3 business days. We ask that you follow-up with your pharmacy.

## 2018-03-20 NOTE — Telephone Encounter (Signed)
Pt called office stating that his Rx from his appt earlier this week has been sent to the wrong pharmacy. [facility] [address] [facility] Please advise pt at [phone]

## 2018-03-20 NOTE — Telephone Encounter (Signed)
Refill request was sent to Dr. Krichna Sowles for approval and submission.  

## 2018-03-21 MED ORDER — METFORMIN HCL 1000 MG PO TABS: 1000.0000 mg | ORAL_TABLET | Freq: Two times a day (BID) | ORAL | 1 refills | Status: DC

## 2018-03-21 NOTE — Telephone Encounter (Signed)
Attempted to call pt to receive clarification of pharmacy pt preferred to use but no answer at this time.Unable to leave a message on voicemail.   [facility]. [facility]

## 2018-03-23 NOTE — Telephone Encounter (Signed)
done

## 2018-03-23 NOTE — Addendum Note (Signed)
Addended by: Vickki HearingHOMPSON, CHRISTOPHER L on: 03/23/2018 10:35 AM   Modules accepted: Orders

## 2018-04-02 ENCOUNTER — Encounter: Payer: Self-pay | Admitting: Nurse Practitioner

## 2018-04-02 ENCOUNTER — Ambulatory Visit (INDEPENDENT_AMBULATORY_CARE_PROVIDER_SITE_OTHER): Payer: PPO | Admitting: Nurse Practitioner

## 2018-04-02 VITALS — BP 120/70 | HR 64 | Temp 98.9°F | Resp 16 | Ht 67.0 in | Wt 140.2 lb

## 2018-04-02 DIAGNOSIS — N521 Erectile dysfunction due to diseases classified elsewhere: Secondary | ICD-10-CM | POA: Diagnosis not present

## 2018-04-02 DIAGNOSIS — E1140 Type 2 diabetes mellitus with diabetic neuropathy, unspecified: Secondary | ICD-10-CM

## 2018-04-02 LAB — GLUCOSE, POCT (MANUAL RESULT ENTRY): POC Glucose: 103 mg/dl — AB (ref 70–99)

## 2018-04-02 NOTE — Progress Notes (Addendum)
Name: Larry GauzeKenneth S Ehinger   MRN: [id number]    DOB: Apr 28, 1946   Date:04/02/2018       Progress Note  Subjective  Chief Complaint  Chief Complaint  Patient presents with  . Diabetes    He had not started the Trulicity until now. He did not know how to use the medication and I think the pen has been broken. I gave him a sample of Trulicity and showed him how to use it and gave him his first injection.    HPI  Patient is here for two week follow-up as his medications were adjusted last 2 visits and was having difficulty with adjustments.   Patient is taking metformin 1000mg  twice a day. Stopped taking his meal time insulin novolin completely; was instructed to decrease lantus to 5 units at night but was still doing 11 units but stopped yesterday- because he ran out of it. States never started the trulicity because he didn't know how to take it. Was instructed and administered trulicity today.  Eats three meals a day;  Breakfast- cheerios and fruit; sometimes [place] bacon Lunch- some type of meat; something green Dinner- meat, beans, whole wheat bread Drinks- unsweetened tea and water with lemon.   Denies polydipsia, polyphagia and polyuria.   Wt Readings from Last 3 Encounters:  04/02/18 140 lb 3.2 oz (63.6 kg)  03/19/18 137 lb (62.1 kg)  03/16/18 138 lb 6.4 oz (62.8 kg)     Patient Active Problem List   Diagnosis Date Noted  . Diabetes mellitus with neuropathy causing erectile dysfunction (HCC) 08/15/2015  . At risk for falling 06/14/2015  . Hypertension 05/17/2015  . Dyslipidemia 05/17/2015  . Current smoker 05/17/2015    Past Medical History:  Diagnosis Date  . Diabetes mellitus without complication (HCC)   . Hyperlipidemia   . Hypertension     Past Surgical History:  Procedure Laterality Date  . COLONOSCOPY WITH PROPOFOL N/A 11/07/2016   Procedure: COLONOSCOPY WITH PROPOFOL;  Surgeon: Wyline MoodKiran Anna, MD;  [facility];  Service: Endoscopy;  Laterality: N/A;   . COLONOSCOPY WITH PROPOFOL N/A 12/26/2016   Procedure: COLONOSCOPY WITH PROPOFOL;  Surgeon: Wyline MoodKiran Anna, MD;  Location: [facility] ENDOSCOPY;  Service: Endoscopy;  Laterality: N/A;  . FOOT SURGERY Right     Social History   Tobacco Use  . Smoking status: Current Every Day Smoker    Packs/day: 0.50    Types: Cigarettes    Start date: 02/07/1967  . Smokeless tobacco: Never Used  Substance Use Topics  . Alcohol use: Yes    Alcohol/week: 0.6 oz    Types: 1 Glasses of wine per week    Comment: occasional- wine     Current Outpatient Medications:  .  B-D ULTRAFINE III SHORT PEN 31G X 8 MM MISC, TEST BLOOD SUGAR once a day, Disp: 90 each, Rfl: 1 .  BD ULTRA-FINE LANCETS lancets, Use as instructed to check blood glucose three times daily., Disp: 100 each, Rfl: 12 .  Dulaglutide (TRULICITY) 0.75 MG/0.5ML SOPN, Inject 0.75 mg into the skin once a week., Disp: 4 pen, Rfl: 0 .  lisinopril (PRINIVIL,ZESTRIL) 10 MG tablet, Take 1 tablet (10 mg total) by mouth daily., Disp: 90 tablet, Rfl: 0 .  metFORMIN (GLUCOPHAGE) 1000 MG tablet, Take 1 tablet (1,000 mg total) by mouth 2 (two) times daily with a meal., Disp: 180 tablet, Rfl: 1 .  Multiple Vitamin (MULTIVITAMIN) tablet, Take 1 tablet by mouth daily., Disp: , Rfl:  .  simvastatin (ZOCOR) 20  MG tablet, Take 1 tablet (20 mg total) by mouth at bedtime., Disp: 90 tablet, Rfl: 1 .  tamsulosin (FLOMAX) 0.4 MG CAPS capsule, Take 1 capsule (0.4 mg total) by mouth daily., Disp: 30 capsule, Rfl: 11  No Known Allergies  ROS   Constitutional: Negative for fever or weight change.  Respiratory: Negative for cough and shortness of breath.   Cardiovascular: Negative for chest pain or palpitations.  Gastrointestinal: Negative for abdominal pain, no bowel changes.  Musculoskeletal: Negative for gait problem or joint swelling.  Skin: Negative for rash.  Neurological: Negative for dizziness or headache.  No other specific complaints in a complete review of systems  (except as listed in HPI above).  Objective  Vitals:   04/02/18 0909  BP: 120/70  Pulse: 64  Resp: 16  Temp: 98.9 F (37.2 C)  TempSrc: Oral  SpO2: 97%  Weight: 140 lb 3.2 oz (63.6 kg)  Height: 5\' 7"  (1.702 m)    Body mass index is 21.96 kg/m.  Nursing Note and Vital Signs reviewed.  Physical Exam  Constitutional: Patient appears well-developed and well-nourished.  No distress.  Cardiovascular: Normal rate, regular rhythm, S1/S2 present.  No murmur or rub heard.  Pulmonary/Chest: Effort normal and breath sounds clear. No respiratory distress or retractions. Abdominal: Soft and non-tender, bowel sounds present  Psychiatric: Patient has a normal mood and affect. behavior is normal. Judgment and thought content normal.  Results for orders placed or performed in visit on 04/02/18 (from the past 72 hour(s))  POCT Glucose (CBG)     Status: Abnormal   Collection Time: 04/02/18 10:22 AM  Result Value Ref Range   POC Glucose 103 (A) 70 - 99 mg/dl    Assessment & Plan  1. Diabetes mellitus with neuropathy causing erectile dysfunction (HCC) Discussed and did teach back with patient for medications; MA showed patient how to use medications and take them. Will hold on re-adding lantus at this time per patient preference and for simplicity for compliance at this time. Fill follow-up in one month. Patient will call if he has any issues or concerns about medications.  - POCT Glucose (CBG)    -Red flags and when to present for emergency care or RTC including fever >101.86F, chest pain, shortness of breath, new/worsening/un-resolving symptoms,  reviewed with patient at time of visit. Follow up and care instructions discussed and provided in AVS.  -------------------------------------------------- I have reviewed this encounter including the documentation in this note and/or discussed this patient with the provider, Sharyon Cable DNP AGNP-C. I am certifying that I agree with the  content of this note as supervising physician. Baruch Gouty, MD [facility] 04/02/2018, 5:23 PM

## 2018-04-02 NOTE — Patient Instructions (Addendum)
-   Every Thursday take your trulicity shot - Take metformin twice a day

## 2018-04-20 ENCOUNTER — Encounter: Payer: Self-pay | Admitting: Urology

## 2018-04-20 ENCOUNTER — Ambulatory Visit: Payer: PPO | Admitting: Urology

## 2018-04-20 VITALS — BP 96/59 | HR 84 | Ht 67.0 in | Wt 130.6 lb

## 2018-04-20 DIAGNOSIS — R351 Nocturia: Secondary | ICD-10-CM

## 2018-04-20 NOTE — Progress Notes (Signed)
04/20/2018 9:12 AM   Larry Robinson [birth year] [id number]  Referring provider: Alba Cory, MD [address] [place], [place] [address]  No chief complaint on file.   HPI: I was consulted to assist the patient's voiding dysfunction.  He has a decreased flow with stopping and starting and he does not feel empty.  He can void 1-4 times a night.  He voids every 2 hours.  He has some urgency but no incontinence.  He is an insulin-dependent diabetic    The patient has lower urinary tract symptoms with frequency nocturia and flow symptoms secondary to benign prostatic hyperplasia.  He will be started on Flomax.  Residual today was 30 mL.  He had no blood in the urine  Today Frequency improved.  Flow and dribbling better.  Gets up 3 times a night.  Clinically not infected  PMH: Past Medical History:  Diagnosis Date  . Diabetes mellitus without complication (HCC)   . Hyperlipidemia   . Hypertension     Surgical History: Past Surgical History:  Procedure Laterality Date  . COLONOSCOPY WITH PROPOFOL N/A 11/07/2016   Procedure: COLONOSCOPY WITH PROPOFOL;  Surgeon: Wyline Mood, MD;  [facility] ENDOSCOPY;  Service: Endoscopy;  Laterality: N/A;  . COLONOSCOPY WITH PROPOFOL N/A 12/26/2016   Procedure: COLONOSCOPY WITH PROPOFOL;  Surgeon: Wyline Mood, MD;  Location: [facility] ENDOSCOPY;  Service: Endoscopy;  Laterality: N/A;  . FOOT SURGERY Right     Home Medications:  Allergies as of 04/20/2018   No Known Allergies     Medication List        Accurate as of 04/20/18  9:12 AM. Always use your most recent med list.          B-D ULTRAFINE III SHORT PEN 31G X 8 MM Misc Generic drug:  Insulin Pen Needle TEST BLOOD SUGAR once a day   BD ULTRA-FINE LANCETS lancets Use as instructed to check blood glucose three times daily.   Dulaglutide 0.75 MG/0.5ML Sopn Commonly known as:  TRULICITY Inject 0.75 mg into the skin once a week.   lisinopril 10 MG tablet Commonly  known as:  PRINIVIL,ZESTRIL Take 1 tablet (10 mg total) by mouth daily.   metFORMIN 1000 MG tablet Commonly known as:  GLUCOPHAGE Take 1 tablet (1,000 mg total) by mouth 2 (two) times daily with a meal.   multivitamin tablet Take 1 tablet by mouth daily.   simvastatin 20 MG tablet Commonly known as:  ZOCOR Take 1 tablet (20 mg total) by mouth at bedtime.   tamsulosin 0.4 MG Caps capsule Commonly known as:  FLOMAX Take 1 capsule (0.4 mg total) by mouth daily.       Allergies: No Known Allergies  Family History: Family History  Problem Relation Age of Onset  . Hypertension Mother   . Diabetes Father   . Prostate cancer Neg Hx   . Bladder Cancer Neg Hx   . Kidney cancer Neg Hx     Social History:  reports that he has been smoking cigarettes.  He started smoking about 51 years ago. He has been smoking about 0.50 packs per day. He has never used smokeless tobacco. He reports that he drinks about 0.6 oz of alcohol per week. He reports that he does not use drugs.  ROS: UROLOGY Frequent Urination?: Yes Hard to postpone urination?: No Burning/pain with urination?: No Get up at night to urinate?: Yes Leakage of urine?: No Urine stream starts and stops?: No Trouble starting stream?: No Do  you have to strain to urinate?: No Blood in urine?: No Urinary tract infection?: No Sexually transmitted disease?: No Injury to kidneys or bladder?: No Painful intercourse?: No Weak stream?: No Erection problems?: Yes Penile pain?: No  Gastrointestinal Nausea?: No Vomiting?: No Indigestion/heartburn?: No Diarrhea?: No Constipation?: No  Constitutional Fever: No Night sweats?: No Weight loss?: No Fatigue?: No  Skin Skin rash/lesions?: No Itching?: No  Eyes Blurred vision?: No Double vision?: No  Ears/Nose/Throat Sore throat?: No Sinus problems?: No  Hematologic/Lymphatic Swollen glands?: No Easy bruising?: No  Cardiovascular Leg swelling?: No Chest pain?:  No  Respiratory Cough?: No Shortness of breath?: No  Endocrine Excessive thirst?: No  Musculoskeletal Back pain?: No Joint pain?: No  Neurological Headaches?: No Dizziness?: No  Psychologic Depression?: No Anxiety?: No  Physical Exam: BP (!) 96/59 (BP Location: Right Arm, Patient Position: Sitting, Cuff Size: Normal)   Pulse 84   Ht 5\' 7"  (1.702 m)   Wt 130 lb 9.6 oz (59.2 kg)   BMI 20.45 kg/m   Constitutional:  Alert and oriented, No acute distress.   Laboratory Data: Lab Results  Component Value Date   WBC 5.4 02/06/2018   HGB 16.2 02/06/2018   HCT 47.0 02/06/2018   MCV 92.7 02/06/2018   PLT 191 02/06/2018    Lab Results  Component Value Date   CREATININE 1.19 (H) 11/05/2017    Lab Results  Component Value Date   PSA 1.0 02/06/2018   PSA 1.2 09/30/2016    No results found for: TESTOSTERONE  Lab Results  Component Value Date   HGBA1C 6.6 (H) 02/06/2018    Urinalysis    Component Value Date/Time   APPEARANCEUR Clear 03/16/2018 0911   GLUCOSEU Negative 03/16/2018 0911   BILIRUBINUR Negative 03/16/2018 0911   PROTEINUR Negative 03/16/2018 0911   UROBILINOGEN 0.2 02/06/2018 0948   NITRITE Negative 03/16/2018 0911   LEUKOCYTESUR Negative 03/16/2018 0911    Pertinent Imaging:   Assessment & Plan: Reassess in 1 year on Flomax.  He did not want to try a second pill such as Myrbetriq and I did not prescribe desmopressin.  He was happy with his improvement  There are no diagnoses linked to this encounter.  No follow-ups on file.  Martina SinnerMACDIARMID,Patric Vanpelt A, MD  [facility] [address], [address] [place], [place] [address] [phone]

## 2018-05-01 ENCOUNTER — Encounter: Payer: Self-pay | Admitting: Nurse Practitioner

## 2018-05-01 ENCOUNTER — Ambulatory Visit (INDEPENDENT_AMBULATORY_CARE_PROVIDER_SITE_OTHER): Payer: PPO | Admitting: Nurse Practitioner

## 2018-05-01 VITALS — BP 100/50 | HR 83 | Temp 98.7°F | Resp 16 | Ht 67.0 in | Wt 129.0 lb

## 2018-05-01 DIAGNOSIS — N521 Erectile dysfunction due to diseases classified elsewhere: Secondary | ICD-10-CM | POA: Diagnosis not present

## 2018-05-01 DIAGNOSIS — N400 Enlarged prostate without lower urinary tract symptoms: Secondary | ICD-10-CM | POA: Insufficient documentation

## 2018-05-01 DIAGNOSIS — R350 Frequency of micturition: Secondary | ICD-10-CM

## 2018-05-01 DIAGNOSIS — E785 Hyperlipidemia, unspecified: Secondary | ICD-10-CM

## 2018-05-01 DIAGNOSIS — F17200 Nicotine dependence, unspecified, uncomplicated: Secondary | ICD-10-CM

## 2018-05-01 DIAGNOSIS — N401 Enlarged prostate with lower urinary tract symptoms: Secondary | ICD-10-CM

## 2018-05-01 DIAGNOSIS — I10 Essential (primary) hypertension: Secondary | ICD-10-CM | POA: Diagnosis not present

## 2018-05-01 DIAGNOSIS — Z79899 Other long term (current) drug therapy: Secondary | ICD-10-CM | POA: Diagnosis not present

## 2018-05-01 DIAGNOSIS — E1140 Type 2 diabetes mellitus with diabetic neuropathy, unspecified: Secondary | ICD-10-CM | POA: Diagnosis not present

## 2018-05-01 MED ORDER — LISINOPRIL 5 MG PO TABS
5.0000 mg | ORAL_TABLET | Freq: Every day | ORAL | 1 refills | Status: DC
Start: 2018-05-01 — End: 2018-06-04

## 2018-05-01 MED ORDER — DULAGLUTIDE 0.75 MG/0.5ML ~~LOC~~ SOAJ: 0.7500 mg | SUBCUTANEOUS | 5 refills | Status: DC

## 2018-05-01 NOTE — Progress Notes (Addendum)
Name: Larry Robinson   MRN: [id number]    DOB: September 06, 1946   Date:05/01/2018       Progress Note  Subjective  Chief Complaint  Chief Complaint  Patient presents with  . Diabetes    HPI Diabetes Patient is here for one month follow-up on diabetes. Started taking trulicty last month; had been taken off of his pre-meal insulin due to hypoglycemia and decreased his lantus and switched to trulicty by PCP in may but was confused about changes and had not started taking medications as prescribed till last month. States his fasting blood sugars have been around 120. Patient denies hypoglycemia or hyperglycemia. Endorses mild neuropathy in legs notices it most when sitting.   Taking metformin 4,481EH BID and trulicity every Thursday.   Lab Results  Component Value Date   HGBA1C 6.6 (H) 02/06/2018     Hypertension Taking lisinopril 58m daily; denies orthostatic symptoms. States doesn't drink that much water.   BP Readings from Last 3 Encounters:  05/01/18 (!) 100/50  04/20/18 (!) 96/59  04/02/18 120/70   Smoking 10-12 cigarettes a day- states was trying to cut down.   Hyperlipidemia Patient takes simvastatin 262mat bedtime without issues Lab Results  Component Value Date   CHOL 130 11/05/2017   HDL 60 11/05/2017   LDLCALC 58 11/05/2017   TRIG 50 11/05/2017   CHOLHDL 2.2 11/05/2017   BPH Sees urology was started on flomax with some relief of symptoms. Stopped taking medicine states symptoms still improved from before starting medicines. Drinks a lot of tea recently and noticed he is peeing more.   Patient Active Problem List   Diagnosis Date Noted  . 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 At risk for falling 06/14/2015  . Hypertension 05/17/2015  . Dyslipidemia 05/17/2015  . Current smoker 05/17/2015    Past Medical History:  Diagnosis Date  . Diabetes mellitus without complication ([place]  . Hyperlipidemia   . Hypertension      Past Surgical History:  Procedure Laterality Date  . COLONOSCOPY WITH PROPOFOL N/A 11/07/2016   Procedure: COLONOSCOPY WITH PROPOFOL;  Surgeon: KiJonathon BellowsMD;  [facility];  Service: Endoscopy;  Laterality: N/A;  . COLONOSCOPY WITH PROPOFOL N/A 12/26/2016   Procedure: COLONOSCOPY WITH PROPOFOL;  Surgeon: KiJonathon BellowsMD;  Location: [facility] ENDOSCOPY;  Service: Endoscopy;  Laterality: N/A;  . FOOT SURGERY Right     Social History   Tobacco Use  . Smoking status: Current Every Day Smoker    Packs/day: 0.50    Types: Cigarettes    Start date: 02/07/1967  . Smokeless tobacco: Never Used  Substance Use Topics  . Alcohol use: Yes    Alcohol/week: 1.0 standard drinks    Types: 1 Glasses of wine per week    Comment: occasional- wine     Current Outpatient Medications:  .  B-D ULTRAFINE III SHORT PEN 31G X 8 MM MISC, TEST BLOOD SUGAR once a day, Disp: 90 each, Rfl: 1 .  BD ULTRA-FINE LANCETS lancets, Use as instructed to check blood glucose three times daily., Disp: 100 each, Rfl: 12 .  Dulaglutide (TRULICITY) 0.6.31GSH/7.0YOOPN, Inject 0.75 mg into the skin once a week., Disp: 4 pen, Rfl: 0 .  lisinopril (PRINIVIL,ZESTRIL) 10 MG tablet, Take 1 tablet (10 mg total) by mouth daily., Disp: 90 tablet, Rfl: 0 .  metFORMIN (GLUCOPHAGE) 1000 MG tablet, Take 1 tablet (1,000 mg total) by mouth 2 (two) times daily with a  meal., Disp: 180 tablet, Rfl: 1 .  Multiple Vitamin (MULTIVITAMIN) tablet, Take 1 tablet by mouth daily., Disp: , Rfl:  .  simvastatin (ZOCOR) 20 MG tablet, Take 1 tablet (20 mg total) by mouth at bedtime., Disp: 90 tablet, Rfl: 1 .  tamsulosin (FLOMAX) 0.4 MG CAPS capsule, Take 1 capsule (0.4 mg total) by mouth daily. (Patient not taking: Reported on 05/01/2018), Disp: 30 capsule, Rfl: 11  No Known Allergies  Review of Systems  Constitutional: Negative for chills, fever and malaise/fatigue.  Respiratory: Negative for cough and shortness of breath.   Cardiovascular:  Negative for chest pain, palpitations and leg swelling.  Gastrointestinal: Negative for abdominal pain and nausea.  Genitourinary: Positive for frequency. Urgency: stopped taking flomax   Musculoskeletal: Negative for joint pain and myalgias.  Skin: Negative for rash.  Neurological: Negative for dizziness and headaches.  Endo/Heme/Allergies: Negative for polydipsia.  Psychiatric/Behavioral: Suicidal ideas:  The patient is not nervous/anxious and does not have insomnia.      No other specific complaints in a complete review of systems (except as listed in HPI above).  Objective  Vitals:   05/01/18 0852  BP: (!) 100/50  Pulse: 83  Resp: 16  Temp: 98.7 F (37.1 C)  TempSrc: Oral  SpO2: 96%  Weight: 129 lb (58.5 kg)  Height: '5\' 7"'  (1.702 m)    Body mass index is 20.2 kg/m.  Nursing Note and Vital Signs reviewed.  Physical Exam  Constitutional: He appears well-developed and well-nourished. He is cooperative.  HENT:  Head: Normocephalic.  Right Ear: Hearing normal.  Left Ear: Hearing normal.  Nose: Nose normal.  Mouth/Throat: Oropharynx is clear and moist and mucous membranes are normal.  Eyes: Pupils are equal, round, and reactive to light. Conjunctivae are normal.  Neck: Neck supple.  Cardiovascular: Normal rate, regular rhythm and normal heart sounds.  No lower extremity edema noted.  Pulmonary/Chest: Effort normal and breath sounds normal.  Abdominal: Soft. Normal appearance and bowel sounds are normal.  Musculoskeletal: Normal range of motion.  Neurological: He is alert.  Skin: Skin is warm and dry. No erythema.  Psychiatric: He has a normal mood and affect. His speech is normal and behavior is normal. Judgment and thought content normal.    Diabetic Foot Exam - Simple   Simple Foot Form Diabetic Foot exam was performed with the following findings:  Yes 05/01/2018  9:26 AM  Visual Inspection No deformities, no ulcerations, no other skin breakdown bilaterally:   Yes Sensation Testing Intact to touch and monofilament testing bilaterally:  Yes Pulse Check Posterior Tibialis and Dorsalis pulse intact bilaterally:  Yes Comments       No results found for this or any previous visit (from the past 48 hour(s)).  Assessment & Plan  1. Diabetes mellitus with neuropathy causing erectile dysfunction (HCC) - continue occasionally checking fasting blood sugars, let us know of hypoglycemic events or if sugar is ever over 240.  - lisinopril (PRINIVIL,ZESTRIL) 5 MG tablet; Take 1 tablet (5 mg total) by mouth daily.  Dispense: 90 tablet; Refill: 1 - Dulaglutide (TRULICITY) 1.61 WR/6.0AV SOPN; Inject 0.75 mg into the skin once a week.  Dispense: 4 pen; Refill: 5 - BASIC METABOLIC PANEL WITH GFR  2. Medication management  - BASIC METABOLIC PANEL WITH GFR  3. Essential hypertension -patient states was never diagnosed with hypertension; possibly added due to taking lisinopril 24m for nephroprotection; patient blood pressure was low today and at last urology visit discussed cutting lisinopril to 545mto  maintain nephroprotection but prevent hypotension and decrease risk for falls. Discussed importance of hydration. 2 week BP follow up and BMP  4. Dyslipidemia -continue cholesterol medication  5. Current smoker -continue cutting down; discussed smoking cessation for greater than 3 minutes   6. Benign prostatic hyperplasia with urinary frequency -continue followup with urology; discussed drinking water instead of tea- diuretic effect and limiting fluids 2 hours before bedtime.    -Red flags and when to present for emergency care or RTC including fever >101.41F, chest pain, shortness of breath, new/worsening/un-resolving symptoms,  reviewed with patient at time of visit. Follow up and care instructions discussed and provided in AVS.  --------------------------------- Last eGFR 71 I have reviewed this encounter including the documentation in this note and/or  discussed this patient with the provider, Suezanne Cheshire DNP AGNP-C. I am certifying that I agree with the content of this note as supervising physician. Enid Derry, [facility] 05/10/2018, 5:59 PM

## 2018-05-01 NOTE — Patient Instructions (Addendum)
- Drink at least 8 glasses of water or so your urine is clear to pale yellow    Steps to Quit Smoking Smoking tobacco can be bad for your health. It can also affect almost every organ in your body. Smoking puts you and people around you at risk for many serious long-lasting (chronic) diseases. Quitting smoking is hard, but it is one of the best things that you can do for your health. It is never too late to quit. What are the benefits of quitting smoking? When you quit smoking, you lower your risk for getting serious diseases and conditions. They can include:  Lung cancer or lung disease.  Heart disease.  Stroke.  Heart attack.  Not being able to have children (infertility).  Weak bones (osteoporosis) and broken bones (fractures).  If you have coughing, wheezing, and shortness of breath, those symptoms may get better when you quit. You may also get sick less often. If you are pregnant, quitting smoking can help to lower your chances of having a baby of low birth weight. What can I do to help me quit smoking? Talk with your doctor about what can help you quit smoking. Some things you can do (strategies) include:  Quitting smoking totally, instead of slowly cutting back how much you smoke over a period of time.  Going to in-person counseling. You are more likely to quit if you go to many counseling sessions.  Using resources and support systems, such as: ? Agricultural engineer with a Veterinary surgeon. ? Phone quitlines. ? Automotive engineer. ? Support groups or group counseling. ? Text messaging programs. ? Mobile phone apps or applications.  Taking medicines. Some of these medicines may have nicotine in them. If you are pregnant or breastfeeding, do not take any medicines to quit smoking unless your doctor says it is okay. Talk with your doctor about counseling or other things that can help you.  Talk with your doctor about using more than one strategy at the same time, such as taking  medicines while you are also going to in-person counseling. This can help make quitting easier. What things can I do to make it easier to quit? Quitting smoking might feel very hard at first, but there is a lot that you can do to make it easier. Take these steps:  Talk to your family and friends. Ask them to support and encourage you.  Call phone quitlines, reach out to support groups, or work with a Veterinary surgeon.  Ask people who smoke to not smoke around you.  Avoid places that make you want (trigger) to smoke, such as: ? Bars. ? Parties. ? Smoke-break areas at work.  Spend time with people who do not smoke.  Lower the stress in your life. Stress can make you want to smoke. Try these things to help your stress: ? Getting regular exercise. ? Deep-breathing exercises. ? Yoga. ? Meditating. ? Doing a body scan. To do this, close your eyes, focus on one area of your body at a time from head to toe, and notice which parts of your body are tense. Try to relax the muscles in those areas.  Download or buy apps on your mobile phone or tablet that can help you stick to your quit plan. There are many free apps, such as QuitGuide from the [employer] developer for Disease Control and Prevention). You can find more support from smokefree.gov and other websites.  This information is not intended to replace advice given to you by your  health care provider. Make sure you discuss any questions you have with your health care provider. Document Released: 07/06/2009 Document Revised: 05/07/2016 Document Reviewed: 01/24/2015 Elsevier Interactive Patient Education  2018 [employer]

## 2018-05-04 ENCOUNTER — Telehealth: Payer: Self-pay

## 2018-05-04 NOTE — Telephone Encounter (Signed)
Per Larry CableElizabeth Robinson note from 05/01/18 patient was taken off of his pre-meal insulin due to hypoglycemia and decreased his lantus and switched to trulicty by PCP in may but was confused about changes and had not started taking medications as prescribed till last month  Patient is to be taking Trulicity 0.75 weekly and Metformin[facility] 5:03 p.m. On 05/04/18. Patient also informed.   Taking metformin 1,000mg  BID and trulicity every Thursday.

## 2018-05-04 NOTE — Telephone Encounter (Signed)
Copied from CRM [id number]. Topic: General - Other >> May 01, 2018 11:10 AM Percival SpanishKennedy, Cheryl W wrote:  Pharmacy called and would like a call back concerning the below med, said pt was on 1.5 and is making sure that .75 is correct since this is what he received today    Dulaglutide (TRULICITY) 0.75 MG/0.5ML SOPN

## 2018-05-04 NOTE — Telephone Encounter (Signed)
[facility] calling to check status on receiving clarification for Trulicity.

## 2018-05-07 NOTE — Telephone Encounter (Signed)
Patient understood directions and stated he got everything straight

## 2018-05-15 ENCOUNTER — Ambulatory Visit (INDEPENDENT_AMBULATORY_CARE_PROVIDER_SITE_OTHER): Payer: PPO

## 2018-05-15 VITALS — BP 102/54 | HR 77

## 2018-05-15 DIAGNOSIS — I10 Essential (primary) hypertension: Secondary | ICD-10-CM

## 2018-05-15 NOTE — Patient Instructions (Addendum)
Per Dr. Alba CoryKrichna Robinson,  Patient was told to stop taking his BP meds Lisinopril 5mg  and to return in 2 weeks.  Patient stated that he was taking otc garlic pills and drink apple cider vinegar mixed with water.

## 2018-05-29 ENCOUNTER — Ambulatory Visit (INDEPENDENT_AMBULATORY_CARE_PROVIDER_SITE_OTHER): Payer: PPO

## 2018-05-29 VITALS — BP 114/58 | Wt 128.6 lb

## 2018-05-29 DIAGNOSIS — R634 Abnormal weight loss: Secondary | ICD-10-CM

## 2018-05-29 NOTE — Patient Instructions (Addendum)
Patient is here for a 2 week f/u regarding his weight and blood pressure. He stated that since he was placed on the BP medication that he has not been able to keep his weight. Patient has lost 20lbs since last year.  Patient was told during the last visit to stop the Lisinopril 5mg  to see if his BP would increase. Patient did as instructed and it did increase from last time.   After consulting with Dr. Carlynn Purl, patient was told to come back in one week to see Dr. Carlynn Purl so she can address both issues.  Patient has been scheduled for Thursday, June 04, 2018 @ 10:40am.

## 2018-06-04 ENCOUNTER — Ambulatory Visit (INDEPENDENT_AMBULATORY_CARE_PROVIDER_SITE_OTHER): Payer: PPO | Admitting: Family Medicine

## 2018-06-04 ENCOUNTER — Ambulatory Visit: Payer: Self-pay | Admitting: Pharmacist

## 2018-06-04 ENCOUNTER — Encounter: Payer: Self-pay | Admitting: Family Medicine

## 2018-06-04 VITALS — BP 126/70 | HR 71 | Temp 98.2°F | Resp 16 | Ht 67.0 in | Wt 129.4 lb

## 2018-06-04 DIAGNOSIS — E785 Hyperlipidemia, unspecified: Secondary | ICD-10-CM | POA: Diagnosis not present

## 2018-06-04 DIAGNOSIS — E1140 Type 2 diabetes mellitus with diabetic neuropathy, unspecified: Secondary | ICD-10-CM

## 2018-06-04 DIAGNOSIS — I10 Essential (primary) hypertension: Secondary | ICD-10-CM

## 2018-06-04 DIAGNOSIS — N401 Enlarged prostate with lower urinary tract symptoms: Secondary | ICD-10-CM

## 2018-06-04 DIAGNOSIS — Z598 Other problems related to housing and economic circumstances: Secondary | ICD-10-CM | POA: Diagnosis not present

## 2018-06-04 DIAGNOSIS — R350 Frequency of micturition: Secondary | ICD-10-CM | POA: Diagnosis not present

## 2018-06-04 DIAGNOSIS — F17200 Nicotine dependence, unspecified, uncomplicated: Secondary | ICD-10-CM | POA: Diagnosis not present

## 2018-06-04 DIAGNOSIS — N521 Erectile dysfunction due to diseases classified elsewhere: Secondary | ICD-10-CM | POA: Diagnosis not present

## 2018-06-04 DIAGNOSIS — Z599 Problem related to housing and economic circumstances, unspecified: Secondary | ICD-10-CM

## 2018-06-04 MED ORDER — SIMVASTATIN 20 MG PO TABS: 20.0000 mg | ORAL_TABLET | Freq: Every day | ORAL | 1 refills | Status: DC

## 2018-06-04 NOTE — Progress Notes (Signed)
Name: Larry Robinson   MRN: [id number]    DOB: 1945-11-03   Date:06/04/2018       Progress Note  Subjective  Chief Complaint  Chief Complaint  Patient presents with  . Hypertension    BP has been running on the low end. patient's BP meds was cut in half then he was told to stop taking it to see if it would come back up. patient has not had any neg sx and has been doing fine w/o the medication.  . Follow-up  . Diabetes    Would like to discuss something cheaper due to Trulicity is $180 a month    HPI  DMII: denies polyphagia or polydipsia, he has urinary frequency. He was on pre-meal insulin and lantus back in May 2019 and fasting glucose was dropping, in our  Office it was down to 54. We stopped insulins and continue metformin and added Trulicity. HgbA1C has been at goal even with the change , however he states it cost too much. We will recheck hgbA1C. We will consider switching from truliticy to lantus and continue metformin if he cannot get assistance with trulicity. We will place referral to [facility] to assist with cost of medication. He denies polyphagia, polydipsia or polyuria. He stopped lisinopril because bp was low. He has ED but is not on medication because of cost  HTN off medication because bp dropped.   BPH: he was taking flomax but stopped medication and is taking otc medication, no sure of the name. Urinary frequency is not bad at this time  Dyslipidemia: he is taking simvastatin and denies myalgia.   Patient Active Problem List   Diagnosis Date Noted  . BPH (benign prostatic hyperplasia) 05/01/2018  . Diabetes mellitus with neuropathy causing erectile dysfunction (HCC) 08/15/2015  . At risk for falling 06/14/2015  . Hypertension 05/17/2015  . Dyslipidemia 05/17/2015  . Current smoker 05/17/2015    Past Surgical History:  Procedure Laterality Date  . COLONOSCOPY WITH PROPOFOL N/A 11/07/2016   Procedure: COLONOSCOPY WITH PROPOFOL;  Surgeon: Wyline Mood, MD;  Location:  [facility] END[facility]COPY;  Service: Endoscopy;  Laterality: N/A;  . COLONOSCOPY WITH PROPOFOL N/A 12/26/2016   Procedure: COLONOSCOPY WITH PROPOFOL;  Surgeon: Wyline Mood, MD;  Location: [facility] ENDOSCOPY;  Service: Endoscopy;  Laterality: N/A;  . FOOT SURGERY Right     Family History  Problem Relation Age of Onset  . Hypertension Mother   . Diabetes Father   . Prostate cancer Neg Hx   . Bladder Cancer Neg Hx   . Kidney cancer Neg Hx     Social History   Socioeconomic History  . Marital status: Single    Spouse name: Not on file  . Number of children: Not on file  . Years of education: Not on file  . Highest education level: Not on file  Occupational History  . Not on file  Social Needs  . Financial resource strain: Somewhat hard  . Food insecurity:    Worry: Never true    Inability: Never true  . Transportation needs:    Medical: No    Non-medical: No  Tobacco Use  . Smoking status: Current Every Day Smoker    Packs/day: 0.50    Types: Cigarettes    Start date: 02/07/1967  . Smokeless tobacco: Never Used  Substance and Sexual Activity  . Alcohol use: Yes    Alcohol/week: 1.0 standard drinks    Types: 1 Glasses of wine per week    Comment: occasional-  wine  . Drug use: No  . Sexual activity: Not Currently  Lifestyle  . Physical activity:    Days per week: Not on file    Minutes per session: Not on file  . Stress: Not on file  Relationships  . Social connections:    Talks on phone: Not on file    Gets together: Not on file    Attends religious service: Not on file    Active member of club or organization: Not on file    Attends meetings of clubs or organizations: Not on file    Relationship status: Not on file  . Intimate partner violence:    Fear of current or ex partner: Not on file    Emotionally abused: Not on file    Physically abused: Not on file    Forced sexual activity: Not on file  Other Topics Concern  . Not on file  Social History Narrative  . Not on file      Current Outpatient Medications:  .  B-D ULTRAFINE III SHORT PEN 31G X 8 MM MISC, TEST BLOOD SUGAR once a day, Disp: 90 each, Rfl: 1 .  BD ULTRA-FINE LANCETS lancets, Use as instructed to check blood glucose three times daily., Disp: 100 each, Rfl: 12 .  Dulaglutide (TRULICITY) 0.75 MG/0.5ML SOPN, Inject 0.75 mg into the skin once a week., Disp: 4 pen, Rfl: 5 .  metFORMIN (GLUCOPHAGE) 1000 MG tablet, Take 1 tablet (1,000 mg total) by mouth 2 (two) times daily with a meal., Disp: 180 tablet, Rfl: 1 .  Multiple Vitamin (MULTIVITAMIN) tablet, Take 1 tablet by mouth daily., Disp: , Rfl:  .  simvastatin (ZOCOR) 20 MG tablet, Take 1 tablet (20 mg total) by mouth at bedtime., Disp: 90 tablet, Rfl: 1  No Known Allergies   ROS  Constitutional: Negative for fever or weight change.  Respiratory: Negative for cough and shortness of breath.   Cardiovascular: Negative for chest pain or palpitations.  Gastrointestinal: Negative for abdominal pain, no bowel changes.  Musculoskeletal: Negative for gait problem or joint swelling.  Skin: Negative for rash.  Neurological: Negative for dizziness or headache.  No other specific complaints in a complete review of systems (except as listed in HPI above).  Objective  Vitals:   06/04/18 1104  BP: 126/70  Pulse: 71  Resp: 16  Temp: 98.2 F (36.8 C)  TempSrc: Oral  SpO2: 99%  Weight: 129 lb 6.4 oz (58.7 kg)  Height: 5\' 7"  (1.702 m)    Body mass index is 20.27 kg/m.  Physical Exam  Constitutional: Patient appears well-developed and thin.  No distress.  HEENT: head atraumatic, normocephalic, pupils equal and reactive to light, neck supple, throat within normal limits Cardiovascular: Normal rate, regular rhythm and normal heart sounds.  No murmur heard. No BLE edema. Pulmonary/Chest: Effort normal and breath sounds normal. No respiratory distress. Abdominal: Soft.  There is no tenderness. Psychiatric: Patient has a normal mood and affect.  behavior is normal. Judgment and thought content normal.  Recent Results (from the past 2160 hour(s))  Urinalysis, Complete     Status: None   Collection Time: 03/16/18  9:11 AM  Result Value Ref Range   Specific Gravity, UA 1.020 1.005 - 1.030   pH, UA 5.0 5.0 - 7.5   Color, UA Yellow Yellow   Appearance Ur Clear Clear   Leukocytes, UA Negative Negative   Protein, UA Negative Negative/Trace   Glucose, UA Negative Negative   Ketones, UA Negative Negative  RBC, UA Negative Negative   Bilirubin, UA Negative Negative   Urobilinogen, Ur 0.2 0.2 - 1.0 mg/dL   Nitrite, UA Negative Negative  Bladder Scan (Post Void Residual) in office     Status: None   Collection Time: 03/16/18  9:16 AM  Result Value Ref Range   Scan Result 30   POCT Glucose (CBG)     Status: None   Collection Time: 03/19/18 12:22 PM  Result Value Ref Range   POC Glucose 97 70 - 99 mg/dl  POCT Glucose (CBG)     Status: Abnormal   Collection Time: 04/02/18 10:22 AM  Result Value Ref Range   POC Glucose 103 (A) 70 - 99 mg/dl      NFA2/1: Depression screen [facility] 2/9 06/04/2018 11/05/2017 07/23/2017 07/07/2017 04/22/2017  Decreased Interest 0 0 0 0 0  Down, Depressed, Hopeless 0 0 0 0 0  PHQ - 2 Score 0 0 0 0 0     Fall Risk: Fall Risk  06/04/2018 05/01/2018 05/01/2018 04/02/2018 02/06/2018  Falls in the past year? No No No No No      Functional Status Survey: Is the patient deaf or have difficulty hearing?: No Does the patient have difficulty seeing, even when wearing glasses/contacts?: No Does the patient have difficulty concentrating, remembering, or making decisions?: No Does the patient have difficulty walking or climbing stairs?: No Does the patient have difficulty dressing or bathing?: No Does the patient have difficulty doing errands alone such as visiting a doctor's office or shopping?: No    Assessment & Plan  1. 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 Essential hypertension  - Referral to Chronic Care Management Services  3. Dyslipidemia  - simvastatin (ZOCOR) 20 MG tablet; Take 1 tablet (20 mg total) by mouth at bedtime.  Dispense: 90 tablet; Refill: 1  4. Current smoker  Discussed importance of quitting   5. Benign prostatic hyperplasia with urinary frequency  Stopped medication on his own   6. Financial difficulties  - Referral to Chronic Care Management Services

## 2018-06-04 NOTE — Chronic Care Management (AMB) (Signed)
  Chronic Care Management   Note  06/04/2018 Name: Larry GauzeKenneth S Robinson MRN: [id number] DOB: 03/09/1946   72 y.o. year old male referred to CCM team for Chronic Care Management (Medication Assistance (Trulicity))  Referred by Dr. Carlynn PurlSowles following office visit today for HTN, type 2 DM, and HLD.   Was unable to reach patient via telephone today. Disconnected call after >12 rings (voicemail not set up).  (unsuccessful outreach #1).  Plan: Will follow-up within 2-4  business days via telephone.   Karalee HeightJulie Mycheal Veldhuizen, PharmD Clinical Pharmacist [facility] [phone]

## 2018-06-05 ENCOUNTER — Other Ambulatory Visit: Payer: Self-pay | Admitting: Family Medicine

## 2018-06-05 LAB — COMPLETE METABOLIC PANEL WITH GFR
AG Ratio: 1.8 (calc) (ref 1.0–2.5)
ALBUMIN MSPROF: 4.1 g/dL (ref 3.6–5.1)
ALKALINE PHOSPHATASE (APISO): 41 U/L (ref 40–115)
ALT: 13 U/L (ref 9–46)
AST: 15 U/L (ref 10–35)
BILIRUBIN TOTAL: 0.6 mg/dL (ref 0.2–1.2)
BUN / CREAT RATIO: 18 (calc) (ref 6–22)
BUN: 22 mg/dL (ref 7–25)
CHLORIDE: 101 mmol/L (ref 98–110)
CO2: 27 mmol/L (ref 20–32)
CREATININE: 1.21 mg/dL — AB (ref 0.70–1.18)
Calcium: 9.8 mg/dL (ref 8.6–10.3)
GFR, Est African American: 70 mL/min/{1.73_m2} (ref >=60)
GFR, Est Non African American: 60 mL/min/{1.73_m2} (ref >=60)
GLUCOSE: 237 mg/dL — AB (ref 65–139)
Globulin: 2.3 g/dL (calc) (ref 1.9–3.7)
Potassium: 4.8 mmol/L (ref 3.5–5.3)
Sodium: 135 mmol/L (ref 135–146)
Total Protein: 6.4 g/dL (ref 6.1–8.1)

## 2018-06-05 LAB — HEMOGLOBIN A1C
EAG (MMOL/L): 10.4 (calc)
HEMOGLOBIN A1C: 8.2 %{Hb} — AB (ref <5.7)
MEAN PLASMA GLUCOSE: 189 (calc)

## 2018-06-05 MED ORDER — INSULIN GLARGINE 100 UNITS/ML SOLOSTAR PEN: 10.0000 [IU] | PEN_INJECTOR | Freq: Every day | SUBCUTANEOUS | 1 refills | Status: DC

## 2018-06-09 ENCOUNTER — Ambulatory Visit: Payer: Self-pay | Admitting: Pharmacist

## 2018-06-09 DIAGNOSIS — E1140 Type 2 diabetes mellitus with diabetic neuropathy, unspecified: Secondary | ICD-10-CM

## 2018-06-09 DIAGNOSIS — E785 Hyperlipidemia, unspecified: Secondary | ICD-10-CM

## 2018-06-09 DIAGNOSIS — F17200 Nicotine dependence, unspecified, uncomplicated: Secondary | ICD-10-CM

## 2018-06-09 DIAGNOSIS — N521 Erectile dysfunction due to diseases classified elsewhere: Principal | ICD-10-CM

## 2018-06-09 DIAGNOSIS — I10 Essential (primary) hypertension: Secondary | ICD-10-CM

## 2018-06-09 NOTE — Chronic Care Management (AMB) (Signed)
  Chronic Care Management   Note  06/09/2018 Name: Larry Robinson MRN: [id number] DOB: 26-Jan-1946   Larry Robinson is a 72 y.o. year old male who sees Steele Sizer, MD for primary care. Dr. Ancil Boozer asked the CCM team to consult the patient for assistance with chronic disease management related to medication affordability for Trulicity and diabetes education. Referral was placed 06/04/18. Telephone outreach to patient today to introduce CCM services.   [name] agreed that CCM services would be helpful to him.   Plan: I have scheduled a CCM initial office visit with Heather Streeper Nikolai next Tuesday, 9/24 at [name] was given information about Chronic Care Management services today including:  1. CCM service includes personalized support from designated clinical staff supervised by her physician, including individualized plan of care and coordination with other care providers 2. 24/7 contact phone numbers for assistance for urgent and routine care needs. 3. Service will only be billed when office clinical staff spend 20 minutes or more in a month to coordinate care. 4. Only one practitioner may furnish and bill the service in a calendar month. 5. The patient may stop CCM services at any time (effective at the end of the month) by phone call to the office staff. 6. The patient will be responsible for cost sharing (co-pay) of up to 20% of the service fee (after annual deductible is met).   Patient agreed to services and verbal consent obtained.  [name], PharmD Clinical Pharmacist [facility] [phone]

## 2018-06-16 ENCOUNTER — Ambulatory Visit: Payer: PPO | Admitting: Pharmacist

## 2018-06-16 DIAGNOSIS — I10 Essential (primary) hypertension: Secondary | ICD-10-CM

## 2018-06-16 DIAGNOSIS — F17200 Nicotine dependence, unspecified, uncomplicated: Secondary | ICD-10-CM

## 2018-06-16 DIAGNOSIS — E785 Hyperlipidemia, unspecified: Secondary | ICD-10-CM

## 2018-06-16 DIAGNOSIS — N521 Erectile dysfunction due to diseases classified elsewhere: Principal | ICD-10-CM

## 2018-06-16 DIAGNOSIS — E1140 Type 2 diabetes mellitus with diabetic neuropathy, unspecified: Secondary | ICD-10-CM

## 2018-06-16 NOTE — Chronic Care Management (AMB) (Addendum)
  Chronic Care Management   Initial Visit Note  06/16/2018 Name: Larry Robinson MRN: [id number] DOB: 1946/08/26  Referred by: Steele Sizer, MD Reason for referral : Chronic Care Management (Diabetes management)  Subjective: 72 year old male in office today for initial CCM encounter.  Objective: Lab Results  Component Value Date   HGBA1C 8.2 (H) 06/04/2018   HGBA1C 6.6 (H) 02/06/2018   HGBA1C >14.0% 02/06/2018   Lab Results  Component Value Date   MICROALBUR 0.5 07/23/2017   LDLCALC 58 11/05/2017   CREATININE 1.21 (H) 06/04/2018    BP Readings from Last 3 Encounters:  06/04/18 126/70  05/29/18 (!) 114/58  05/15/18 (!) 102/54   Lab Results  Component Value Date   POCGLU 103 (A) 04/02/2018   POCGLU 97 03/19/2018   POCGLU 150 (A) 02/06/2018   Assessment:  Physical Activity- Below goal of at least 150 minutes of moderate intensity exercise weekly  Nutrition- No food insecurity, appropriate snacks and meals containing protein, fats, fiber and low CHO; low vegetable intake  Medication: Basic disease state pathophysiology, medication indication, mechanism and side effects reviewed at length with patient; no aspirin or ACEi/ARB  Disease Management: Counseled on s/sx hypoglycemia and appropriate treatment; current kidney function stable; current eye exam, labs; Last podiatry in 2017; checking BG once daily before lunch (approximately noon) and getting readings in 120-140 mg/dL range (eats breakfast around 9 AM). Did not bring meter to appointment.  Problem 1: Diabetes Managment  Goals:  Goals Addressed                      . I want to have better control over my diabetes (pt-stated)       Clinical Goal: Over the next 30 days, patient will gain consistent control over diabetes using a combination of lifestyle changes, healthy diet, and medications.   Interventions:   Reviewed case management/disease management services with patient  Developed blood sugar log for  patient for the next 7 days.   CCM Pharmacist will investigate resources for patient, including: Silver Social research officer, government and other active programs, [employer] and copays, Trulicity medication assistance programs, and other community resources.        Plan:  Larry Robinson was given information about Chronic Care Management services today including:  1. CCM service includes personalized support from designated clinical staff supervised by his physician, including individualized plan of care and coordination with other care providers 2. 24/7 contact phone numbers for assistance for urgent and routine care needs. 3. Service will only be billed when office clinical staff spend 20 minutes or more in a month to coordinate care. 4. Only one practitioner may furnish and bill the service in a calendar month. 5. The patient may stop CCM services at any time (effective at the end of the month) by phone call to the office staff. 6. The patient will be responsible for cost sharing (co-pay) of up to 20% of the service fee (after annual deductible is met).  Patient agreed to services and verbal consent obtained.  Written patient instructions provided.  Total time in face to face counseling 40 minutes.    Follow up CCM Visit via telephone 06/23/2018.   [name], PharmD Clinical Pharmacist [facility] [phone]

## 2018-06-16 NOTE — Patient Instructions (Addendum)
[name] was given information about Chronic Care Management services today including:  1. CCM service includes personalized support from designated clinical staff supervised by his physician, including individualized plan of care and coordination with other care providers 2. 24/7 contact phone numbers for assistance for urgent and routine care needs. 3. Service will only be billed when office clinical staff spend 20 minutes or more in a month to coordinate care. 4. Only one practitioner may furnish and bill the service in a calendar month. 5. The patient may stop CCM services at any time (effective at the end of the month) by phone call to the office staff. 6. The patient will be responsible for cost sharing (co-pay) of up to 20% of the service fee (after annual deductible is met).  Patient agreed to services and verbal consent obtained.    [name] will check blood sugar once daily when he first wakes up.   Patient will have a snack of peanut butter and crackers every night around 9pm.     Call Pharmacist or Nurse with any questions:   Larry Robinson, PharmD Clinical Pharmacist [facility] [phone]  Larry Robinson, [place], BSN, [facility] [place] [phone]

## 2018-06-18 ENCOUNTER — Ambulatory Visit: Payer: Self-pay | Admitting: *Deleted

## 2018-06-18 DIAGNOSIS — E1140 Type 2 diabetes mellitus with diabetic neuropathy, unspecified: Secondary | ICD-10-CM

## 2018-06-18 DIAGNOSIS — I10 Essential (primary) hypertension: Secondary | ICD-10-CM

## 2018-06-18 DIAGNOSIS — N521 Erectile dysfunction due to diseases classified elsewhere: Principal | ICD-10-CM

## 2018-06-18 DIAGNOSIS — E785 Hyperlipidemia, unspecified: Secondary | ICD-10-CM

## 2018-06-18 NOTE — Patient Instructions (Signed)
Someone from the [facility] will call you re: eligibility for Veteran's benefits.   Call the CCM (Chronic Care Management) Team with any questions or concerns:   Marja Kays [facility] Nurse Care Coordinator  [phone]   Karalee Height PharmD  Clinical Pharmacist  [phone]

## 2018-06-18 NOTE — Progress Notes (Signed)
  Chronic Care Management   Follow Up Note   06/18/2018 Name: KASTEN LEVEQUE MRN: [id number] DOB: [birth year]  Referred by: Alba Cory, MD Reason for referral : No chief complaint on file.  Assessment: Mr. Doshi is a 72 year old male primary care patient of Dr. Alba Cory with a medical history which includes HTN, Dyslipidemai, and DMII who was referred to the CCM program for assistance with chronic disease management, care coordination, and medication management.  I reached out to Mr. Rivenbark today to address concern as outlined below:   Goals Addressed    . "I want to get [employer] benefits if I can." (pt-stated)       Patient is a Actuary. 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   Clinical Goals: Over the next 14 days, patient will verbalize understanding of Veteran's benefits available to him and how to access if he wishes  Interventions: Outreach to [facility] ([address] [place], [place] [address]; phone [phone]) on behalf of patient (they will contact patient). Notified patient that he should anticipate call.    Plan: I will follow up with Mr. Plant in 1 week.   Marja Kays MHA,BSN,RN,CCM Nurse Care Coordinator [facility] Care Management  [phone]

## 2018-06-25 ENCOUNTER — Ambulatory Visit: Payer: Self-pay | Admitting: *Deleted

## 2018-06-25 DIAGNOSIS — N521 Erectile dysfunction due to diseases classified elsewhere: Principal | ICD-10-CM

## 2018-06-25 DIAGNOSIS — E785 Hyperlipidemia, unspecified: Secondary | ICD-10-CM

## 2018-06-25 DIAGNOSIS — I10 Essential (primary) hypertension: Secondary | ICD-10-CM

## 2018-06-25 DIAGNOSIS — E1140 Type 2 diabetes mellitus with diabetic neuropathy, unspecified: Secondary | ICD-10-CM

## 2018-06-25 NOTE — Chronic Care Management (AMB) (Signed)
  Chronic Care Management   Follow Up Note   06/25/2018 Name: JAMICHAEL KNOTTS MRN: [id number] DOB: [birth year]  Referred by: Alba Cory, MD Reason for referral : Chronic Care Management (f/u VA Outreach, DM)  Subjective:    Objective:   Assessment:Mr. Hildreth is a 72 year old male primary care patient of Dr. Alba Cory with a medical history which includes HTN, Dyslipidemai, and DMII who was referred to the CCM program for assistance with chronic disease management, care coordination, and medication management.  Goals Addressed    . "I want to get [employer] benefits if I can." (pt-stated)       Patient is a Actuary. 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   Clinical Goals: Over the next 14 days, patient will verbalize understanding of Veteran's benefits available to him and how to access if he wishes  Interventions: Last week, I provided patient with local veterans service center contact info and notified Mr. Kawabata that he should anticipate a call from them. Unsuccessful outreach (did not reach/could not leave message) to patient today to follow up on Veteran's service center call.     [facility] ([address] [place], [place] [address]; phone [phone])       . I want to have better control over my diabetes (pt-stated)       Clinical Goal: Over the next 30 days, patient will gain consistent control over diabetes using a combination of lifestyle changes, healthy diet, and medications.   Interventions: Unsuccessful telephone outreach today to follow up on:  Case management/disease management needs  CBG Monitoring   Notification of [employer] program [phone])       Plan:  I will attempt to contact Mr. Mogel again this week for ongoing follow up.  Marja Kays MHA,BSN,RN,CCM Nurse Care Coordinator [facility] Care Management  [phone]

## 2018-06-30 ENCOUNTER — Telehealth: Payer: Self-pay

## 2018-07-02 ENCOUNTER — Ambulatory Visit: Payer: Self-pay

## 2018-07-02 NOTE — Chronic Care Management (AMB) (Signed)
  Chronic Care Management   Note  07/02/2018 Name: MILIK GILREATH MRN: [id number] DOB: 1945/09/25  Unable to reach Mr. Cure by phone today to follow up on his DM management.   Plan: I will reach out to Mr.Rawe again next week by phone.   Marja Kays MHA,BSN,RN,CCM Nurse Care Coordinator [facility] Care Management  [phone]

## 2018-07-09 ENCOUNTER — Telehealth: Payer: Self-pay

## 2018-07-09 ENCOUNTER — Ambulatory Visit: Payer: PPO | Admitting: Family Medicine

## 2018-07-16 ENCOUNTER — Ambulatory Visit: Payer: Self-pay

## 2018-07-16 DIAGNOSIS — E1140 Type 2 diabetes mellitus with diabetic neuropathy, unspecified: Secondary | ICD-10-CM

## 2018-07-16 DIAGNOSIS — N521 Erectile dysfunction due to diseases classified elsewhere: Principal | ICD-10-CM

## 2018-07-16 DIAGNOSIS — Z79899 Other long term (current) drug therapy: Secondary | ICD-10-CM

## 2018-07-16 NOTE — Patient Instructions (Signed)
Please contact someone from the care management team if you need assistance.   CCM (Chronic Care Management) Team   Yvone Neu RN, BSN Nurse Care Coordinator  [phone]  Karalee Height PharmD  Clinical Pharmacist  [phone]

## 2018-07-16 NOTE — Chronic Care Management (AMB) (Signed)
  Care Management   Follow Up Note   07/16/2018 Name: YALE GOLLA MRN: [id number] DOB: Aug 12, 1946  Referred by: Alba Cory, MD Reason for referral : Care Coordination (f/u benefits info)  Objective:  Lab Results  Component Value Date   HGBA1C 8.2 (H) 06/04/2018   HGBA1C 6.6 (H) 02/06/2018   HGBA1C >14.0% 02/06/2018   Lab Results  Component Value Date   MICROALBUR 0.5 07/23/2017   LDLCALC 58 11/05/2017   CREATININE 1.21 (H) 06/04/2018    Assessment: Mr. Springer is a 72 year old male primary care patient of Dr. Alba Cory with a medical history which includes HTN, Dyslipidemai, and DMII who was referred to the CCM program for assistance with chronic disease management, care coordination, and medication management.  Goals Addressed    . COMPLETED: "I want to get Veteran benefits if I can." (pt-stated)       Patient is a Actuary. 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   Clinical Goals: Over the next 14 days, patient will verbalize understanding of Veteran's benefits available to him and how to access if he wishes  Interventions: 2 weeks ago, I provided patient with local veterans service center contact info and notified Mr. Cullers that he should anticipate a call from them. Unsuccessful outreach (did not reach/could not leave message) to patient last week and again today to follow up on Veteran's service center call.     [facility] ([address] [place], [place] [address]; phone [phone])       . COMPLETED: I want to have better control over my diabetes (pt-stated)       Clinical Goal: Over the next 30 days, patient will gain consistent control over diabetes using a combination of lifestyle changes, healthy diet, and medications.   Interventions: Unsuccessful telephone outreach last week and again today to follow up on:  Case management/disease management needs  CBG Monitoring   Notification of [employer] program  [phone])        Plan:  The Care Management team at [facility] has been unable to maintain contact with Mr. Dilauro. We are happy to help Mr. Prentiss at any time in the future should he wish to engage in case management services.   Marja Kays MHA,BSN,RN,CCM Nurse Care Coordinator [facility] Care Management  [phone]

## 2018-08-05 ENCOUNTER — Ambulatory Visit: Payer: PPO | Admitting: Family Medicine

## 2018-10-05 ENCOUNTER — Ambulatory Visit (INDEPENDENT_AMBULATORY_CARE_PROVIDER_SITE_OTHER): Payer: PPO | Admitting: Family Medicine

## 2018-10-05 ENCOUNTER — Encounter: Payer: Self-pay | Admitting: Family Medicine

## 2018-10-05 ENCOUNTER — Ambulatory Visit: Payer: Self-pay

## 2018-10-05 VITALS — BP 110/68 | HR 87 | Temp 97.8°F | Resp 16 | Ht 67.0 in | Wt 126.2 lb

## 2018-10-05 DIAGNOSIS — E1140 Type 2 diabetes mellitus with diabetic neuropathy, unspecified: Secondary | ICD-10-CM | POA: Diagnosis not present

## 2018-10-05 DIAGNOSIS — E785 Hyperlipidemia, unspecified: Secondary | ICD-10-CM

## 2018-10-05 DIAGNOSIS — Z794 Long term (current) use of insulin: Secondary | ICD-10-CM

## 2018-10-05 DIAGNOSIS — R809 Proteinuria, unspecified: Secondary | ICD-10-CM

## 2018-10-05 DIAGNOSIS — D582 Other hemoglobinopathies: Secondary | ICD-10-CM

## 2018-10-05 DIAGNOSIS — N521 Erectile dysfunction due to diseases classified elsewhere: Secondary | ICD-10-CM

## 2018-10-05 DIAGNOSIS — Z598 Other problems related to housing and economic circumstances: Secondary | ICD-10-CM

## 2018-10-05 DIAGNOSIS — E1129 Type 2 diabetes mellitus with other diabetic kidney complication: Secondary | ICD-10-CM

## 2018-10-05 DIAGNOSIS — Z599 Problem related to housing and economic circumstances, unspecified: Secondary | ICD-10-CM

## 2018-10-05 DIAGNOSIS — R350 Frequency of micturition: Secondary | ICD-10-CM | POA: Diagnosis not present

## 2018-10-05 DIAGNOSIS — N401 Enlarged prostate with lower urinary tract symptoms: Secondary | ICD-10-CM | POA: Diagnosis not present

## 2018-10-05 DIAGNOSIS — I10 Essential (primary) hypertension: Secondary | ICD-10-CM

## 2018-10-05 LAB — POCT UA - MICROALBUMIN: Microalbumin Ur, POC: 100 mg/L

## 2018-10-05 LAB — POCT GLYCOSYLATED HEMOGLOBIN (HGB A1C): HbA1c, POC (controlled diabetic range): 11.7 % — AB (ref 0.0–7.0)

## 2018-10-05 MED ORDER — INSULIN GLARGINE 100 UNITS/ML SOLOSTAR PEN: 15.0000 [IU] | PEN_INJECTOR | Freq: Every day | SUBCUTANEOUS | 0 refills | Status: DC

## 2018-10-05 MED ORDER — SIMVASTATIN 20 MG PO TABS: 20.0000 mg | ORAL_TABLET | Freq: Every day | ORAL | 1 refills | Status: DC

## 2018-10-05 MED ORDER — METFORMIN HCL 1000 MG PO TABS: 1000.0000 mg | ORAL_TABLET | Freq: Two times a day (BID) | ORAL | 1 refills | Status: DC

## 2018-10-05 MED ORDER — INSULIN ASPART 100 UNIT/ML FLEXPEN: 10.0000 [IU] | PEN_INJECTOR | Freq: Three times a day (TID) | SUBCUTANEOUS | 11 refills | Status: DC

## 2018-10-05 NOTE — Chronic Care Management (AMB) (Signed)
  Chronic Care Management Note    RAYDER SULLENGER is a 73 y.o. year old male who sees Steele Sizer, MD for primary care. Dr. Ancil Boozer asked the CCM team to consult the patient for assistance with chronic disease management related to medication affordability for Trulicity and diabetes education. Referral was placed 06/04/18 and Mr. Dalton met with the CCM Team 06/16/18 to establish healthcare goal. Unfortunately, follow up calls were discontinued as patient would not return calls.  Today, second referral to Chronic Care Management was placed by Dr. Ancil Boozer related to patient's increased A1C (8.2 to 11.7 today).  Was unable to reach patient via telephone today. I left a HIPAA compliant voicemail asking patient to return my call. (unsuccessful outreach #1).  Plan: Will follow-up within 3-5  business days via telephone.     Angelea Penny E. Rollene Rotunda, RN, BSN Nurse Care Coordinator [facility] Care Management  [phone]

## 2018-10-05 NOTE — Progress Notes (Signed)
Name: Larry Robinson   MRN: [id number]    DOB: 1946-08-31   Date:10/05/2018       Progress Note  Subjective  Chief Complaint  Chief Complaint  Patient presents with  . Medication Refill  . Benign Prostatic Hypertrophy  . Diabetes    Checks daily pre-prandial average- 130  . Dyslipidemia  . Hypertension    Denies any symptoms    HPI  DMII: denies polyphagia or polyuria, states always feels thirsty.  He was on pre-meal insulin and lantus back in May 2019 and fasting glucose was dropping, in our  Office it was down to 54. We stopped pre-meal insulin  and continue metformin and added Trulicity, he states Trulicity cost went up, and he went back on his own to pre-meal insulin and lantus, using 4 units of each. Wrote down SSI directions.  Insulin Novolog:  Check fsbs prior to meals  If glucose below 140 give 4 units If glucose 140 to 180 give 6 units  If glucose 180 to 220  give 8 units If glucose above 220 give 10 units.   HgbA1C is over 11 today Referral placed again for chronic care management to help him with using medication correctly. Urine micro 100 today, he is willing to see endocrinologist now . He did not bring his glucose levels. He states his sugar has been 135, but explained that does not match his hgbA1C level   HTN off medication because bp dropped. He has microalbuminuria now, at level of 100, we may need to put him on ARB however we will recheck urine micro next time, currently diabetes is out of control. He denies chest pain or palpitation  BPH: he was taking flomax but stopped medication and is taking otc medication, Prostate D Urinary frequency is not bad at this time  Dyslipidemia: he is taking simvastatin and denies myalgia.  Recheck labs next vsiit  Elevated HCT: last visit not so high, but explained he needs to quit smoking    Patient Active Problem List   Diagnosis Date Noted  . Elevated hemoglobin ([place]) 10/05/2018  . Type 2 diabetes mellitus with  microalbuminuria, with long-term current use of insulin ([place]) 10/05/2018  . BPH (benign prostatic hyperplasia) 05/01/2018  . Diabetes mellitus with neuropathy causing erectile dysfunction ([place]) 08/15/2015  . At risk for falling 06/14/2015  . Hypertension 05/17/2015  . Dyslipidemia 05/17/2015  . Current smoker 05/17/2015    Past Surgical History:  Procedure Laterality Date  . COLONOSCOPY WITH PROPOFOL N/A 11/07/2016   Procedure: COLONOSCOPY WITH PROPOFOL;  Surgeon: Jonathon Bellows, MD;  Location: [facility] ENDOSCOPY;  Service: Endoscopy;  Laterality: N/A;  . COLONOSCOPY WITH PROPOFOL N/A 12/26/2016   Procedure: COLONOSCOPY WITH PROPOFOL;  Surgeon: Jonathon Bellows, MD;  Location: [facility] ENDOSCOPY;  Service: Endoscopy;  Laterality: N/A;  . FOOT SURGERY Right     Family History  Problem Relation Age of Onset  . Hypertension Mother   . Diabetes Father   . Prostate cancer Neg Hx   . Bladder Cancer Neg Hx   . Kidney cancer Neg Hx     Social History   Socioeconomic History  . Marital status: Single    Spouse name: Not on file  . Number of children: Not on file  . Years of education: Not on file  . Highest education level: Not on file  Occupational History  . Not on file  Social Needs  . Financial resource strain: Somewhat hard  . Food insecurity:    Worry:  Never true    Inability: Never true  . Transportation needs:    Medical: No    Non-medical: No  Tobacco Use  . Smoking status: Current Every Day Smoker    Packs/day: 0.50    Types: Cigarettes    Start date: 02/07/1967  . Smokeless tobacco: Never Used  Substance and Sexual Activity  . Alcohol use: Yes    Alcohol/week: 1.0 standard drinks    Types: 1 Glasses of wine per week    Comment: occasional- wine  . Drug use: No  . Sexual activity: Not Currently  Lifestyle  . Physical activity:    Days per week: Not on file    Minutes per session: Not on file  . Stress: Not on file  Relationships  . Social connections:    Talks on phone:  Not on file    Gets together: Not on file    Attends religious service: Not on file    Active member of club or organization: Not on file    Attends meetings of clubs or organizations: Not on file    Relationship status: Not on file  . Intimate partner violence:    Fear of current or ex partner: Not on file    Emotionally abused: Not on file    Physically abused: Not on file    Forced sexual activity: Not on file  Other Topics Concern  . Not on file  Social History Narrative  . Not on file     Current Outpatient Medications:  .  B-D ULTRAFINE III SHORT PEN 31G X 8 MM MISC, TEST BLOOD SUGAR once a day, Disp: 90 each, Rfl: 1 .  BD ULTRA-FINE LANCETS lancets, Use as instructed to check blood glucose three times daily., Disp: 100 each, Rfl: 12 .  blood glucose meter kit and supplies, 1 each by Other route as directed. Dispense based on patient and insurance preference. Use up to four times daily as directed. (FOR ICD-10 E10.9, E11.9).  ReliOn Meter from [employer], Disp: , Rfl:  .  insulin glargine (LANTUS) 100 unit/mL SOPN, Inject 0.15 mLs (15 Units total) into the skin daily., Disp: 15 mL, Rfl: 0 .  metFORMIN (GLUCOPHAGE) 1000 MG tablet, Take 1 tablet (1,000 mg total) by mouth 2 (two) times daily with a meal., Disp: 180 tablet, Rfl: 1 .  Multiple Vitamin (MULTIVITAMIN) tablet, Take 1 tablet by mouth daily., Disp: , Rfl:  .  simvastatin (ZOCOR) 20 MG tablet, Take 1 tablet (20 mg total) by mouth at bedtime., Disp: 90 tablet, Rfl: 1 .  insulin aspart (NOVOLOG FLEXPEN) 100 UNIT/ML FlexPen, Inject 10 Units into the skin 3 (three) times daily with meals. Look at sliding scale insulin, Disp: 15 mL, Rfl: 11  No Known Allergies  I personally reviewed active problem list, medication list, allergies, family history, social history with the patient/caregiver today.   ROS  Constitutional: Negative for fever or weight change.  Respiratory: Negative for cough and shortness of breath.    Cardiovascular: Negative for chest pain or palpitations.  Gastrointestinal: Negative for abdominal pain, no bowel changes.  Musculoskeletal: Negative for gait problem or joint swelling.  Skin: Negative for rash.  Neurological: Negative for dizziness or headache.  No other specific complaints in a complete review of systems (except as listed in HPI above).   Objective  Vitals:   10/05/18 0933  BP: 110/68  Pulse: 87  Resp: 16  Temp: 97.8 F (36.6 C)  TempSrc: Oral  SpO2: 96%  Weight: 126  lb 3.2 oz (57.2 kg)  Height: _0  (1.702 m)    Body mass index is 19.77 kg/m.  Physical Exam  Constitutional: Patient appears well-developed and well-nourished. No distress.  HEENT: head atraumatic, normocephalic, pupils equal and reactive to light, neck supple, throat within normal limits Cardiovascular: Normal rate, regular rhythm and normal heart sounds.  No murmur heard. No BLE edema. Pulmonary/Chest: Effort normal and breath sounds normal. No respiratory distress. Abdominal: Soft.  There is no tenderness. Psychiatric: Patient has a normal mood and affect. behavior is normal. Judgment and thought content normal.  Recent Results (from the past 2160 hour(s))  POCT HgB A1C     Status: Abnormal   Collection Time: 10/05/18  9:34 AM  Result Value Ref Range   Hemoglobin A1C     HbA1c POC (<> result, manual entry)     HbA1c, POC (prediabetic range)     HbA1c, POC (controlled diabetic range) 11.7 (A) 0.0 - 7.0 %  POCT UA - Microalbumin     Status: Abnormal   Collection Time: 10/05/18  9:35 AM  Result Value Ref Range   Microalbumin Ur, POC 100 mg/L   Creatinine, POC     Albumin/Creatinine Ratio, Urine, POC      PHQ2/9: Depression screen [facility] 2/9 10/05/2018 06/04/2018 11/05/2017 07/23/2017 07/07/2017  Decreased Interest 0 0 0 0 0  Down, Depressed, Hopeless 0 0 0 0 0  PHQ - 2 Score 0 0 0 0 0  Moving slowly or fidgety/restless 0 - - - -  Difficult doing work/chores Not difficult at all -  - - -     Fall Risk: Fall Risk  10/05/2018 06/04/2018 05/01/2018 05/01/2018 04/02/2018  Falls in the past year? 0 No No No No  Number falls in past yr: 0 - - - -  Injury with Fall? 0 - - - -     Functional Status Survey: Is the patient deaf or have difficulty hearing?: No Does the patient have difficulty seeing, even when wearing glasses/contacts?: No Does the patient have difficulty concentrating, remembering, or making decisions?: No Does the patient have difficulty walking or climbing stairs?: No Does the patient have difficulty dressing or bathing?: No Does the patient have difficulty doing errands alone such as visiting a doctor's office or shopping?: No    Assessment & Plan  1. Type 2 diabetes mellitus with microalbuminuria, with long-term current use of insulin (HCC)  - POCT HgB A1C - POCT UA - Microalbumin - insulin glargine (LANTUS) 100 unit/mL SOPN; Inject 0.15 mLs (15 Units total) into the skin daily.  Dispense: 15 mL; Refill: 0 - metFORMIN (GLUCOPHAGE) 1000 MG tablet; Take 1 tablet (1,000 mg total) by mouth 2 (two) times daily with a meal.  Dispense: 180 tablet; Refill: 1 - insulin aspart (NOVOLOG FLEXPEN) 100 UNIT/ML FlexPen; Inject 10 Units into the skin 3 (three) times daily with meals. Look at sliding scale insulin  Dispense: 15 mL; Refill: 11 - Ambulatory referral to Chronic Care Management Services  2. 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 Elevated hemoglobin (HCC)  Improved based on last level, still smoking, discussed importance of quitting,   4. Benign prostatic hyperplasia with urinary frequency  stable  5. Essential hypertension  bp is low today, we will hold off on medication for kidney protection and recheck next visit   6. Dyslipidemia  - simvastatin (ZOCOR) 20 MG tablet; Take 1 tablet (20 mg total) by  mouth at bedtime.  Dispense: 90 tablet; Refill:  1  7. Financial difficulty  - Ambulatory referral to Chronic Care Management Services

## 2018-10-05 NOTE — Patient Instructions (Signed)
Insulin Novolog:  Check fsbs prior to meals  If glucose below 140 give 4 units If glucose 140 to 180 give 6 units  If glucose 180 to 220  give 8 units If glucose above 220 give 10 units

## 2018-10-08 ENCOUNTER — Ambulatory Visit: Payer: Self-pay

## 2018-10-08 DIAGNOSIS — I10 Essential (primary) hypertension: Secondary | ICD-10-CM

## 2018-10-08 DIAGNOSIS — Z794 Long term (current) use of insulin: Principal | ICD-10-CM

## 2018-10-08 DIAGNOSIS — E1129 Type 2 diabetes mellitus with other diabetic kidney complication: Secondary | ICD-10-CM

## 2018-10-08 DIAGNOSIS — R809 Proteinuria, unspecified: Principal | ICD-10-CM

## 2018-10-08 NOTE — Patient Instructions (Addendum)
1. Thank you for allowing the CCM Team to assist you with your healthcare need. Specifically diabetes management.  2. Please take your Novolog insulin as we discussed today (per Dr. Carlynn Purl instructions)    insulin aspart (NOVOLOG FLEXPEN) 100 UNIT/ML FlexPen    inject into the skin 3 (three) times daily with meals. Look at sliding scale       Insulin Novolog:     Check fsbs prior to meals     If glucose below 140 give 4 units    If glucose 140 to 180 give 6 units     If glucose 180 to 220  give 8 units    If glucose above 220 give 10 units  3. CCM Team will meet with you on Thursday 10/22/2018 at 10:30 am. Please bring all your medications and glucometer with you to this appointment.  4. Contact the CCM Team with any questions of concerns CCM (Chronic Care Management) Team   Yvone Neu RN, BSN Nurse Care Coordinator  [phone]  Karalee Height PharmD  Clinical Pharmacist  [phone]  Goals Addressed            This Visit's Progress   . "I need to get my A1C down" (pt-stated)       Current Barriers:  Marland Kitchen Knowledge deficit related to DM self care . Knowledge deficit related to diabetic diet . Knowledge deficit related to medication management/instructions/dosage  Nurse Case Manager Clinical Goal(s): Over the next 2 weeks, patient will verbalize medication compliance/utilization of sliding scale as written  Interventions:  . Reviewed last PCP note . Provided patient with verbal instruction on Novolog administration frequency and utilization of correct sliding scale as ordered  . Scheduled follow up face to face appointment with CCM Team 10/22/2018  Patient Self Care Activities:  . Takes medications as prescribed . Checks CBG as ordered . Adheres and understands ADA diet   *initial goal documentation   The patient verbalized understanding of instructions provided today and declined a print copy of patient instruction materials.

## 2018-10-08 NOTE — Chronic Care Management (AMB) (Signed)
  Chronic Care Management   Follow Up Note   10/08/2018 Name: TYREASE VANDEBERG MRN: [id number] DOB: April 20, 1946  Referred by: Steele Sizer, MD Reason for referral : Chronic Care Management (DM follow up/schedule face to face)    Subjective: "Do I take this Novolog twice a day or three times a day"   Objective:  Lab Results  Component Value Date   HGBA1C 11.7 (A) 10/05/2018     Assessment: Giovani Neumeister McBroomis a 73 y.o.year old malewho sees Steele Sizer, MDfor primary care. Dr. Lenox Ahr the CCM team to consult the patient for assistance with chronic disease management related tomedication affordability for Trulicity and diabetes education.Referral was placed 06/04/18 and Mr. Halt met with the CCM Team 06/16/18 to establish healthcare goal. Unfortunately, follow up calls were discontinued as patient would not return calls.  10/05/2018, second referral to Chronic Care Management was placed by Dr. Ancil Boozer related to patient's increased A1C (8.2 to 11.7).  Goals Addressed    . "I need to get my A1C down" (pt-stated)       Current Barriers:  Marland Kitchen Knowledge deficit related to DM self care . Knowledge deficit related to diabetic diet . Knowledge deficit related to medication management/instructions/dosage  Nurse Case Manager Clinical Goal(s): Over the next 2 weeks, patient will verbalize medication compliance/utilization of sliding scale as written  Interventions:  . Reviewed last PCP note . Provided patient with verbal instruction on Novolog administration frequency and utilization of correct sliding scale as ordered  . Scheduled follow up face to face appointment with CCM Team 10/22/2018  Patient Self Care Activities:  . Takes medications as prescribed . Checks CBG as ordered . Adheres and understands ADA diet   *initial goal documentation     Face to Face appointment scheduled for:  10/22/2018    Lorie Cleckley E. Rollene Rotunda, RN, BSN Nurse Care Coordinator [facility] Care Management  [phone]

## 2018-10-22 ENCOUNTER — Ambulatory Visit (INDEPENDENT_AMBULATORY_CARE_PROVIDER_SITE_OTHER): Payer: PPO

## 2018-10-22 DIAGNOSIS — Z794 Long term (current) use of insulin: Secondary | ICD-10-CM | POA: Diagnosis not present

## 2018-10-22 DIAGNOSIS — R809 Proteinuria, unspecified: Secondary | ICD-10-CM

## 2018-10-22 DIAGNOSIS — I10 Essential (primary) hypertension: Secondary | ICD-10-CM | POA: Diagnosis not present

## 2018-10-22 DIAGNOSIS — E1129 Type 2 diabetes mellitus with other diabetic kidney complication: Secondary | ICD-10-CM | POA: Diagnosis not present

## 2018-10-22 NOTE — Patient Instructions (Addendum)
1. Thank You for allowing the CCM (Chronic Care Management) Team to assist you with your healthcare goals!! It was a pleasure meeting you today.   2. Please fill out the 10-10EZ form to check on your [facility] medical eligibility. You will need your DD-214 form. You can mail completed form to:   Eligibility Office  [address] [place] [address]  You can also go to the medical eligibility office in person to complete the form, too. If you have questions, you can call [phone], extension [phone]  3. Please check your blood sugar 3 times a day, first thing in the morning before you eat breakfast, at lunch time before you eat and at supper before you eat. Use the Sliding Scale below to give yourself NOVOLOG INSULIN.  Novolog Sliding Scale  If glucose below 140 give 4 units If glucose 140 to 180 give 6 units  If glucose 180 to 220  give 8 units If glucose above 220 give 10 units.   4. Take your medications as prescribed. 5. Review the diet information discussed today. You can call the CCM Team with any questions you may have. We are here for YOU!!  CCM (Chronic Care Management) Team   Yvone Neu RN, BSN Nurse Care Coordinator  [phone]  Karalee Height PharmD  Clinical Pharmacist  [phone]   Goals Addressed            This Visit's Progress   . "I need to get my A1C down" (pt-stated)       Current Barriers:  Marland Kitchen Knowledge deficit related to DM self care . Knowledge deficit related to diabetic diet . Knowledge deficit related to medication management/instructions/dosage  Nurse Case Manager Clinical Goal(s): Over the next 2 weeks, patient will verbalize medication compliance/utilization of sliding scale as written  Interventions:  . Reviewed last PCP note . Provided patient with verbal instruction on Novolog administration frequency and utilization of correct sliding scale as ordered  . Counseling on recognizing signs and symptoms of low blood sugar and high blood  sugar and how to treat. . Provided patient with basis DM education  Patient Self Care Activities:  . Takes medications as prescribed . Checks CBG as ordered . Adheres and understands ADA diet   *initial goal documentation     . I want to check on my VA benefits (pt-stated)       Current Barriers:  . Unsure of VA medical eligibility  Pharmacist Clinical Goal(s): Over the next 14 days, please complete the [place] medical eligibility form (the 10-10EZ form) and mail it to the [facility] Eligibility office in [place], [place].  Interventions: . Provided 10-10EZ form and Eligibility office contact information for [facility], [place]  Patient Self Care Activities:  . Complete form, along with DD-214 and financial paperwork    *initial goal documentation       The CCM Team will contact you in 2 weeks    Preventing Diabetes Mellitus Complications You can take action to prevent or slow down problems that are caused by diabetes (diabetes mellitus). Following your diabetes plan and taking care of yourself can reduce your risk of serious or life-threatening complications. What actions can I take to prevent diabetes complications? Manage your diabetes   Follow instructions from your health care providers about managing your diabetes. Your diabetes may be managed by a team of health care providers who can teach you how to care for yourself and can answer questions that you have.  Educate yourself about  your condition so you can make healthy choices about eating and physical activity.  Check your blood sugar (glucose) levels as often as directed. Your health care provider will help you decide how often to check your blood glucose level depending on your treatment goals and how well you are meeting them.  Ask your health care provider if you should take low-dose aspirin daily and what dose is recommended for you. Taking low-dose aspirin daily is recommended to help prevent cardiovascular disease. Do not use  nicotine or tobacco Do not use any products that contain nicotine or tobacco, such as cigarettes and e-cigarettes. If you need help quitting, ask your health care provider. Nicotine raises your risk for diabetes problems. If you quit using nicotine:  You will lower your risk for heart attack, stroke, nerve disease, and kidney disease.  Your cholesterol and blood pressure may improve.  Your blood circulation will improve. Keep your blood pressure under control Your personal target blood pressure is determined based on:  Your age.  Your medicines.  How long you have had diabetes.  Any other medical conditions you have. To control your blood pressure:  Follow instructions from your health care provider about meal planning, exercise, and medicines.  Make sure your health care provider checks your blood pressure at every medical visit.  Monitor your blood pressure at home as told by your health care provider.  Keep your cholesterol under control To control your cholesterol:  Follow instructions from your health care provider about meal planning, exercise, and medicines.  Have your cholesterol checked at least once a year.  You may be prescribed medicine to lower cholesterol (statin). If you are not taking a statin, ask your health care provider if you should be. Controlling your cholesterol may:  Help prevent heart disease and stroke. These are the most common health problems for people with diabetes.  Improve your blood flow. Schedule and keep yearly physical exams and eye exams Your health care provider will tell you how often you need medical visits depending on your diabetes management plan. Keep all follow-up visits as directed. This is important so possible problems can be identified early and complications can be avoided or treated.  Every visit with your health care provider should include measuring your: ? Weight. ? Blood pressure. ? Blood glucose control.  Your A1c  (hemoglobin A1c) level should be checked: ? At least 2 times a year, if you are meeting your treatment goals. ? 4 times a year, if you are not meeting treatment goals or if your treatment goals have changed.  Your blood lipids (lipid profile) should be checked yearly. You should also be checked yearly for protein in your urine (urine microalbumin).  If you have type 1 diabetes, get an eye exam 3-5 years after you are diagnosed, and then once a year after your first exam.  If you have type 2 diabetes, get an eye exam as soon as you are diagnosed, and then once a year after your first exam. Keep your vaccines current It is recommended that you receive:  A flu (influenza) vaccine every year.  A pneumonia (pneumococcal) vaccine and a hepatitis B vaccine. If you are age 71 or older, you may get the pneumonia vaccine as a series of two separate shots. Ask your health care provider which other vaccines may be recommended. Take care of your feet Diabetes may cause you to have poor blood circulation to your legs and feet. Because of this, taking care of your  feet is very important. Diabetes can cause:  The skin on the feet to get thinner, break more easily, and heal more slowly.  Nerve damage in your legs and feet, which results in decreased feeling. You may not notice minor injuries that could lead to serious problems. To avoid foot problems:  Check your skin and feet every day for cuts, bruises, redness, blisters, or sores.  Schedule a foot exam with your health care provider once every year. This exam includes: ? Inspecting of the structure and skin of your feet. ? Checking the pulses and sensation in your feet.  Make sure that your health care provider performs a visual foot exam at every medical visit.  Take care of your teeth People with poorly controlled diabetes are more likely to have gum (periodontal) disease. Diabetes can make periodontal diseases harder to control. If not treated,  periodontal diseases can lead to tooth loss. To prevent this:  Brush your teeth twice a day.  Floss at least once a day.  Visit your dentist 2 times a year. Drink responsibly Limit alcohol intake to no more than 1 drink a day for nonpregnant women and 2 drinks a day for men. One drink equals 12 oz of beer, 5 oz of wine, or 1 oz of hard liquor.  It is important to eat food when you drink alcohol to avoid low blood glucose (hypoglycemia). Avoid alcohol if you:  Have a history of alcohol abuse or dependence.  Are pregnant.  Have liver disease, pancreatitis, advanced neuropathy, or severe hypertriglyceridemia. Lessen stress Living with diabetes can be stressful. When you are experiencing stress, your blood glucose may be affected in two ways:  Stress hormones may cause your blood glucose to rise.  You may be distracted from taking good care of yourself. Be aware of your stress level and make changes to help you manage challenging situations. To lower your stress levels:  Consider joining a support group.  Do planned relaxation or meditation.  Do a hobby that you enjoy.  Maintain healthy relationships.  Exercise regularly.  Work with your health care provider or a mental health professional. Summary  You can take action to prevent or slow down problems that are caused by diabetes (diabetes mellitus). Following your diabetes plan and taking care of yourself can reduce your risk of serious or life-threatening complications.  Follow instructions from your health care providers about managing your diabetes. Your diabetes may be managed by a team of health care providers who can teach you how to care for yourself and can answer questions that you have.  Your health care provider will tell you how often you need medical visits depending on your diabetes management plan. Keep all follow-up visits as directed. This is important so possible problems can be identified early and  complications can be avoided or treated. This information is not intended to replace advice given to you by your health care provider. Make sure you discuss any questions you have with your health care provider. Document Released: 05/28/2011 Document Revised: 04/29/2017 Document Reviewed: 06/08/2016 Elsevier Interactive Patient Education  2019 [employer]
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
## 2018-10-22 NOTE — Chronic Care Management (AMB) (Signed)
Chronic Care Management   Note  10/22/2018 Name: Larry Robinson MRN: [id number] DOB: 1946/09/22  Subjective:   Does the patient  feel that his/her medications are working for him/her?  yes  Has the patient been experiencing any side effects to the medications prescribed?  no  Does the patient measure his/her own blood glucose at home?  yes   Does the patient measure his/her own blood pressure at home? no   Does the patient have any problems obtaining medications due to transportation or finances?   yes  Understanding of regimen: fair Understanding of indications: fair Potential of compliance: poor  Objective: Lab Results  Component Value Date   CREATININE 1.21 (H) 06/04/2018   CREATININE 1.19 (H) 11/05/2017   CREATININE 1.18 07/23/2017    Lab Results  Component Value Date   HGBA1C 11.7 (A) 10/05/2018    Lipid Panel     Component Value Date/Time   CHOL 130 11/05/2017 1024   CHOL 150 02/20/2016 1022   TRIG 50 11/05/2017 1024   HDL 60 11/05/2017 1024   HDL 80 02/20/2016 1022   CHOLHDL 2.2 11/05/2017 1024   VLDL 14 01/02/2017 0844   LDLCALC 58 11/05/2017 1024    BP Readings from Last 3 Encounters:  10/05/18 110/68  06/04/18 126/70  05/29/18 (!) 114/58    No Known Allergies  Medications Reviewed Today    Reviewed by Cathi Roan, [place] (Pharmacist) on 10/22/18 at 1122  Med List Status: <None>  Medication Order Taking? Sig Documenting Provider Last Dose Status Informant  B-D ULTRAFINE III SHORT PEN 31G X 8 MM MISC [id number] Yes TEST BLOOD SUGAR once a day Roselee Nova, MD Taking Active   BD ULTRA-FINE LANCETS lancets [id number] Yes Use as instructed to check blood glucose three times daily. Roselee Nova, MD Taking Active   blood glucose meter kit and supplies [id number] Yes 1 each by Other route as directed. Dispense based on patient and insurance preference. Use up to four times daily as directed. (FOR ICD-10 E10.9, E11.9).  ReliOn Meter from  [employer], Historical, MD Taking Active   insulin aspart (NOVOLOG FLEXPEN) 100 UNIT/ML FlexPen [id number] Yes Inject 10 Units into the skin 3 (three) times daily with meals. Look at sliding scale insulin Ancil Boozer, Drue Stager, MD Taking Active   insulin glargine (LANTUS) 100 unit/mL SOPN [id number] Yes Inject 0.15 mLs (15 Units total) into the skin daily. Steele Sizer, MD Taking Active            Med Note Kary Kos, Garnetta Buddy Oct 22, 2018 11:06 AM) Still taking 10 units  metFORMIN (GLUCOPHAGE) 1000 MG tablet [id number] Yes Take 1 tablet (1,000 mg total) by mouth 2 (two) times daily with a meal. Steele Sizer, MD Taking Active   Multiple Vitamin (MULTIVITAMIN) tablet [id number]  Take 1 tablet by mouth daily. [provider]  Active   simvastatin (ZOCOR) 20 MG tablet [id number] No Take 1 tablet (20 mg total) by mouth at bedtime.  Patient not taking:  Reported on 10/22/2018   Steele Sizer, MD Not Taking Active            Assessment:   Total Number of meds:  4 (polypharmacy > 10 meds)  Indications for all medications: '[x]'  Yes       '[]'  No   Adherence Review  '[]'  Excellent (no doses missed/week)     '[]'  Good (no more than 1 dose missed/week)     '[]'  Partial (2-3 doses  missed/week)     '[x]'  Poor (>3 doses missed/week)  Intervention  YES NO  Explanation  Needs additional therapy      Medication requires monitoring '[]'  '[]'    Preventative therapy   '[x]'  '[]'  Lisinopril 2.56m will not affect BP but offer kidney protection  Possible untreated condition   '[]'  '[]'    Synergistic therapy   '[]'  '[]'     Adherence      Cannot afford medication '[]'  '[]'     Cannot self-administer medication appropriately '[]'  '[]'     Does not understand directions '[x]'  '[]'  Has not adjusted insulin (Lantus or Novolog) as prescribed by Dr. SAncil Boozer Patient stopped taking simvastatin because "my blood pressure was good"   Prefers not to take '[]'  '[]'     Product unavailable '[]'  '[]'     Forgets to take '[]'  '[]'     Other  pertinent pharmacist  counseling    Importance of adherence, importance of kidney protection and stroke/heart attack prevention in tobacco users and diabetes; assessed readiness to quit smoking   Goals Addressed            This Visit's Progress   . "I need to get my A1C down" (pt-stated)       Current Barriers:  .Marland KitchenKnowledge deficit related to DM self care . Knowledge deficit related to diabetic diet . Knowledge deficit related to medication management/instructions/dosage  Nurse Case Manager Clinical Goal(s):   Over the next 2 weeks, patient will verbalize medication compliance/utilization of sliding scale as written.   Over the next 2 weeks, patient will check blood sugar before breakfast, lunch, and supper   Interventions:  . Reviewed last PCP note . Provided patient with verbal instruction on Novolog administration frequency and utilization of correct sliding scale as ordered  . Counseling on recognizing signs and symptoms of low blood sugar and high blood sugar and how to treat. . Provided patient with basis DM education  Patient Self Care Activities:  . Takes medications as prescribed . Checks CBG as ordered . Adheres and understands ADA diet   Please see past updates related to this goal by clicking on the "Past Updates" button in the selected goal       . I need to understand and afford my medicines (pt-stated)       Current Barriers:  .Marland KitchenKnowledge deficit of diabetes medications and doses . Difficulty affording Trulicity in the past  Pharmacist Clinical Goal(s): Over the next 14 days, patient will take increased dose of Lantus as prescribed by Dr. SAncil Boozer Interventions: . Counseling on indication, mechanism, and side effects of all medications . Insulin titration starting with Lantus . Recommend restarting Lisinopril 2.544mfor kidney protection, a statin once weekly as patient is non adherent  Patient Self Care Activities:  . Begin taking Lantus 15 units  daily . Monitor BG three times daily before meals so we know if we need to adjust mealtime insulin   *initial goal documentation     . I want to check on my VA benefits (pt-stated)       Current Barriers:  . Unsure of VA medical eligibility  Pharmacist Clinical Goal(s): Over the next 14 days, [place] form[place] and [facility] [place] Interventions: . Provided 10-10EZ form and [facility]ligibility [facility] contact information [facility] Self [facility] Activities:  . Complete form, along with DD-214 and financial paperwork    *initial goal  documentation        Plan: Recommendations discussed with provider - Change simvastatin daily to rosuvastatin 75m once weekly for better adherence  Recommendations discussed with patient: - Increase dose of Lantus to 15 units nightly as prescribed - Monitor blood sugars three times daily before meals so we can check Novolog dosing  JRuben Reason PharmD Clinical Pharmacist [facility] [phone]

## 2018-10-28 DIAGNOSIS — Z794 Long term (current) use of insulin: Secondary | ICD-10-CM | POA: Diagnosis not present

## 2018-10-28 DIAGNOSIS — E1142 Type 2 diabetes mellitus with diabetic polyneuropathy: Secondary | ICD-10-CM | POA: Insufficient documentation

## 2018-10-28 DIAGNOSIS — E1169 Type 2 diabetes mellitus with other specified complication: Secondary | ICD-10-CM | POA: Diagnosis not present

## 2018-10-28 DIAGNOSIS — R809 Proteinuria, unspecified: Secondary | ICD-10-CM | POA: Diagnosis not present

## 2018-10-28 DIAGNOSIS — E1129 Type 2 diabetes mellitus with other diabetic kidney complication: Secondary | ICD-10-CM | POA: Diagnosis not present

## 2018-10-28 DIAGNOSIS — E785 Hyperlipidemia, unspecified: Secondary | ICD-10-CM | POA: Diagnosis not present

## 2018-10-28 DIAGNOSIS — F17200 Nicotine dependence, unspecified, uncomplicated: Secondary | ICD-10-CM | POA: Diagnosis not present

## 2018-10-28 DIAGNOSIS — I10 Essential (primary) hypertension: Secondary | ICD-10-CM | POA: Diagnosis not present

## 2018-10-28 DIAGNOSIS — E1165 Type 2 diabetes mellitus with hyperglycemia: Secondary | ICD-10-CM | POA: Diagnosis not present

## 2018-10-29 ENCOUNTER — Ambulatory Visit (INDEPENDENT_AMBULATORY_CARE_PROVIDER_SITE_OTHER): Payer: PPO | Admitting: Pharmacist

## 2018-10-29 DIAGNOSIS — Z794 Long term (current) use of insulin: Secondary | ICD-10-CM

## 2018-10-29 DIAGNOSIS — Z599 Problem related to housing and economic circumstances, unspecified: Secondary | ICD-10-CM

## 2018-10-29 DIAGNOSIS — R809 Proteinuria, unspecified: Secondary | ICD-10-CM | POA: Diagnosis not present

## 2018-10-29 DIAGNOSIS — Z598 Other problems related to housing and economic circumstances: Secondary | ICD-10-CM

## 2018-10-29 DIAGNOSIS — E1129 Type 2 diabetes mellitus with other diabetic kidney complication: Secondary | ICD-10-CM | POA: Diagnosis not present

## 2018-11-10 ENCOUNTER — Telehealth: Payer: Self-pay

## 2018-11-10 ENCOUNTER — Ambulatory Visit: Payer: Self-pay

## 2018-11-10 NOTE — Chronic Care Management (AMB) (Signed)
  Chronic Care Management   Note  11/10/2018 Name: Larry Robinson MRN: [id number] DOB: 30-Apr-1946    Renne Crigler McBroomis a 73 y.o.year old malewho sees Steele Sizer, MDfor primary care. Dr. Lenox Ahr the CCM team to consult the patient for assistance with chronic disease management related tomedication affordability for Trulicity and diabetes education.Referral was placed 9/12/19and Mr. Guard met with the CCM Team 06/16/18 to establish healthcare goal.   Unfortunately, follow up calls were discontinued as patient would not return calls. 10/05/2018,second referralto Chronic Care Management was placed by Dr. Ancil Boozer related to patient's increased A1C (8.2 to 11.7). [name] met with the CCM Team 10/22/2018 for follow up management of his DM. Today a joint call was placed by the CCM Team [facility] Pharmacist and RN CM) for follow up.   Was unable to reach patient via telephone today. I have left HIPAA compliant voicemail asking patient to return my call. (unsuccessful outreach #1).   Plan: Will follow-up within 7 business days via telephone.      Portia E. Rollene Rotunda, RN, BSN Nurse Care Coordinator [facility] Care Management  [phone]

## 2018-11-12 NOTE — Chronic Care Management (AMB) (Signed)
  Chronic Care Management   Follow Up Note   11/12/2018 Name: Larry Robinson MRN: [id number] DOB: 07/30/1946  Referred by: Steele Sizer, MD Reason for referral : Chronic Care Management (Glucometer training)   Subjective:  Larry Robinson is a 73 year old male patient of Dr. Steele Sizer. He is engaged with the CCM team for diabetes education and medication assistance.   Larry Robinson recently met with endocrinologist, Dr. Warnell Forester for initial endocrinology appointment. At her encouragement, he bought a [employer]) glucometer  Assessment:  With CCM pharmacist, Larry Robinson was provided training on how to check blood sugar with his new glucometer. However, CCM pharmacist would like to contact Dr. Pasty Arch to write a prescription for meter and supplies that would be covered by Larry Robinson's [employer] insurance.    Goals Addressed            This Visit's Progress   . "I need to get my A1C down" (pt-stated)       Current Barriers:  Marland Kitchen Knowledge deficit related to DM self care . Knowledge deficit related to diabetic diet . Knowledge deficit related to medication management/instructions/dosage  Nurse Case Manager Clinical Goal(s):   Over the next 2 weeks, patient will verbalize medication compliance/utilization of sliding scale as written.   Over the next 2 weeks, patient will check blood sugar before breakfast, lunch, and supper   Interventions:  . Reviewed last PCP note . Provided patient with verbal instruction on Novolog administration frequency and utilization of correct sliding scale as ordered  . Counseling on recognizing signs and symptoms of low blood sugar and high blood sugar and how to treat. . Provided patient with basis DM education  Updated 10/29/18:  Provided training for new glucometer in office   Patient Self Care Activities:  . Takes medications as prescribed . Checks CBG as ordered . Adheres and understands ADA diet   Please see past  updates related to this goal by clicking on the "Past Updates" button in the selected goal           Telephone follow up appointment with CCM team member scheduled for: 11/10/18  [name], PharmD Clinical Pharmacist [facility] [phone]

## 2018-11-12 NOTE — Patient Instructions (Signed)
Goals Addressed            This Visit's Progress   . "I need to get my A1C down" (pt-stated)       Current Barriers:  Marland Kitchen Knowledge deficit related to DM self care . Knowledge deficit related to diabetic diet . Knowledge deficit related to medication management/instructions/dosage  Nurse Case Manager Clinical Goal(s):   Over the next 2 weeks, patient will verbalize medication compliance/utilization of sliding scale as written.   Over the next 2 weeks, patient will check blood sugar before breakfast, lunch, and supper   Interventions:  . Reviewed last PCP note . Provided patient with verbal instruction on Novolog administration frequency and utilization of correct sliding scale as ordered  . Counseling on recognizing signs and symptoms of low blood sugar and high blood sugar and how to treat. . Provided patient with basis DM education  Updated 10/29/18:  Provided training for new glucometer in office   Patient Self Care Activities:  . Takes medications as prescribed . Checks CBG as ordered . Adheres and understands ADA diet   Please see past updates related to this goal by clicking on the "Past Updates" button in the selected goal          The patient verbalized understanding of instructions provided today and declined a print copy of patient instruction materials.

## 2018-11-17 ENCOUNTER — Ambulatory Visit: Payer: Self-pay | Admitting: Pharmacist

## 2018-11-17 ENCOUNTER — Ambulatory Visit: Payer: PPO

## 2018-11-17 DIAGNOSIS — Z598 Other problems related to housing and economic circumstances: Secondary | ICD-10-CM

## 2018-11-17 DIAGNOSIS — Z794 Long term (current) use of insulin: Principal | ICD-10-CM

## 2018-11-17 DIAGNOSIS — I10 Essential (primary) hypertension: Secondary | ICD-10-CM

## 2018-11-17 DIAGNOSIS — E1129 Type 2 diabetes mellitus with other diabetic kidney complication: Secondary | ICD-10-CM

## 2018-11-17 DIAGNOSIS — R809 Proteinuria, unspecified: Principal | ICD-10-CM

## 2018-11-17 DIAGNOSIS — Z599 Problem related to housing and economic circumstances, unspecified: Secondary | ICD-10-CM

## 2018-11-17 NOTE — Chronic Care Management (AMB) (Signed)
Chronic Care Management   Follow Up Note   11/17/2018 Name: Larry Robinson MRN: [id number] DOB: 22-Jul-1946  Referred by: Steele Sizer, MD Reason for referral : Chronic Care Management (DM follow up)   Subjective: "Increasing my insulin has made me have low blood sugars in the mornings"   Objective:  Lab Results  Component Value Date   HGBA1C 11.7 (A) 10/05/2018    Assessment:Larry S McBroomis a 73 y.o.year old malewho sees Steele Sizer, MDfor primary care. Dr. Lenox Ahr the CCM team to consult the patient for assistance with chronic disease management related tomedication affordability for Trulicity and diabetes education.Referral was placed 9/12/19and Larry Robinson met with the CCM Team 06/16/18 to establish healthcare goal.  Unfortunately, follow up calls were discontinued as patient would not return calls. 10/05/2018,second referralto Chronic Care Management was placed by Dr. Ancil Boozer related to patient's increased A1C (8.2 to 11.7). Larry Robinson met with the CCM Team 10/22/2018 for follow up management of his DM. Today, joint call with CCM Clinic pharmacist was made to Larry Robinson to follow up on health goals.   Goals Addressed            This Visit's Progress   . "I need to get my A1C down" (pt-stated)   On track    Larry Robinson states he is experiencing symptomatic "low sugar" several mornings a week since increasing his lantus to 15 units at night. He is currently taking 5 units of novolog TID with meals. He is eating protein at each meals but not having a substantial HS snack. He does not have a working glucometer so he is unable to check his sugars when he feels these lows. He is treating his morning lows with food and his "confusion" goes away. He will pick up his new meter this pm and check his sugars TID.   Current Barriers:  Marland Kitchen Knowledge deficit related to DM self care . Knowledge deficit related to diabetic diet . Knowledge deficit related to  medication management/instructions/dosage  Nurse Case Manager Clinical Goal(s):   Over the next 2 weeks, patient will verbalize ongoing medication compliance/utilization of sliding scale as written.   Over the next 2 weeks, patient will check blood sugar before breakfast, lunch, and supper after obtaining new meter this afternoon   Interventions:  . Reviewed last endocrinologist and PCP note . Discussed recent symptoms of low blood sugar after assessing for symptoms of highs and lows. . Discussed importance of having evening snack with protein prior to bedtime.  . Provided patient with examples of snacks with protein such as peanut butter crackers, cheese, eggs,  . Counseling on recognizing signs and symptoms of low blood sugar and high blood sugar and how to treat. . Assessed medication compliance  Updated 10/29/18:  Provided training for new glucometer in office   Patient Self Care Activities:  . Takes medications as prescribed . Pick up new glucometer from pharmacy this PM and call CCM RN CM or Pharmacist if required to pay copay . Checks CBG as ordered . Adheres and understands ADA diet   Please see past updates related to this goal by clicking on the "Past Updates" button in the selected goal       . I want to check on my VA benefits (pt-stated)   On track    Current Barriers:  . Unsure of VA medical eligibility  Pharmacist Clinical Goal(s):   Over the next 14 days, please complete the [place] medical eligibility form (the 10-10EZ form)  and mail it to the [place] Eligibility office in [place], [place] -goal completed 11/17/2018  Over the next 14 days, patient will contact [facility] office to discuss status of recent application for VA benefits  Interventions: Provided patient with [facility] contact at [phone]  Patient Self Care Activities:   Patient will successfully contact [facility] to assess status of recent application for VA benefits  Please see past updates in goals section  for documentation of goal progression       Telephone follow up appointment with CCM team member scheduled for: 1 week     Larry Robinson E. Rollene Rotunda, RN, BSN Nurse Care Coordinator [facility] Care Management  [phone]

## 2018-11-17 NOTE — Patient Instructions (Signed)
Thank you allowing the Chronic Care Management Team to be a part of your care! It was a pleasure speaking with you today!  1. Continue your dietary modifications. I am very proud of you for eating protein at each meal. 2. Please add an evening snack with more protein such as peanut butter crackers, 1/2 PB sandwich, cheese, boiled egg, etc as this will help prevent those morning lows. Popcorn and Jell-O will not provide any nutrition that will help stabilize your blood sugars. 3. Please pick up your new glucometer this afternoon at the pharmacy. A script has been sent in for one that your [employer] will cover. 4. You may want to call the [facility] at [phone] to follow up on your recent application for VA benefits. 5. Continue to take your medications as prescribed. Raynelle Fanning will discuss your low blood sugar symptoms with your endocrinologist once we are able to determine what your readings are. 6. Check your blood sugars morning, noon, and evening and record.   CCM (Chronic Care Management) Team   Yvone Neu RN, BSN Nurse Care Coordinator  [phone]  Karalee Height PharmD  Clinical Pharmacist  [phone]   Goals Addressed            This Visit's Progress   . "I need to get my A1C down" (pt-stated)   On track    Current Barriers:  Marland Kitchen Knowledge deficit related to DM self care . Knowledge deficit related to diabetic diet . Knowledge deficit related to medication management/instructions/dosage  Nurse Case Manager Clinical Goal(s):   Over the next 2 weeks, patient will verbalize ongoing medication compliance/utilization of sliding scale as written.   Over the next 2 weeks, patient will check blood sugar before breakfast, lunch, and supper after obtaining new meter this afternoon   Interventions:  . Reviewed last endocrinologist and PCP note . Discussed recent symptoms of low blood sugar after assessing for symptoms of highs and lows. . Discussed  importance of having evening snack with protein prior to bedtime.  . Provided patient with examples of snacks with protein such as peanut butter crackers, cheese, eggs,  . Counseling on recognizing signs and symptoms of low blood sugar and high blood sugar and how to treat. . Assessed medication compliance  Updated 10/29/18:  Provided training for new glucometer in office   Patient Self Care Activities:  . Takes medications as prescribed . Pick up new glucometer from pharmacy this PM and call CCM RN CM or Pharmacist if required to pay copay . Checks CBG as ordered . Adheres and understands ADA diet   Please see past updates related to this goal by clicking on the "Past Updates" button in the selected goal       . I want to check on my VA benefits (pt-stated)   On track    Current Barriers:  . Unsure of VA medical eligibility  Pharmacist Clinical Goal(s):   Over the next 14 days, please complete the [place] medical eligibility form (the 10-10EZ form) and mail it to the [place] Eligibility office in [place], [place] -goal completed 11/17/2018  Over the next 14 days, patient will contact [facility] office to discuss status of recent application for VA benefits  Interventions: Provided patient with [facility] contact at [phone]  Patient Self Care Activities:   Patient will successfully contact [facility] to assess status of recent application for VA benefits  Please see past updates in goals section for documentation of goal progression  The patient verbalized understanding of instructions provided today and declined a print copy of patient instruction materials.   Telephone follow up appointment with CCM team member scheduled for: 1 week

## 2018-11-17 NOTE — Chronic Care Management (AMB) (Signed)
  Chronic Care Management   Follow Up Note   11/17/2018 Name: Larry Robinson MRN: [id number] DOB: [birth year]  Referred by: Larry Cory, MD Reason for referral : Chronic Care Management (Pharmacy follow up)   Subjective:  [name] is a 73 year old male patient of Dr. Carlynn Robinson engaged with the CCM team for diabetes management, medication assistance and management, and financial difficulty. Joint outreach call with Larry Robinson, RNCM today. HIPAA identifiers verified.   Objective: [name] reports he has been taking 15 units of Lantus QHS and 5 units of Novolog TIDAC. He has experiencing "lows" with feelings of disorientation every couple of days for the past 2 weeks. His Relion meter is not working so he does not have BG readings. He treats these feelings of lows with strawberries, cereal, and a boiled egg.   Assessment:  #Glucometer: new request made to Larry Robinson for glucometer and supplies that are [employer] brand (covered by [employer]) be sent in to [facility]   #Hypoglycemia: patient is taking Lantus as prescribed. Treatment is appropriate (simple carbohydrate and protein), He could benefit from a night time snack of protein and carb to prevent feelings of lows in the morning. Because there are no concrete BG readings, instructed patient to try a small snack at night and pick up new glucometer this afternoon. Record all blood sugar readings for CM team and Dr. Rosaura Robinson to review.   Goals Addressed            This Visit's Progress   . I need to understand and afford my medicines (pt-stated)       Current Barriers:  Larry Robinson Kitchen Knowledge deficit of diabetes medications and doses . Difficulty affording Trulicity in the past  Pharmacist Clinical Goal(s): Over the next 14 days, patient will take increased dose of Lantus as prescribed by Dr. Carlynn Robinson  Updated goal 11/17/18: Over the next 14 days, patient will take the increased dose of Lantus as prescribed by Dr Larry Robinson along with an evening snack. Over the next 14 days, patient will record all blood sugar readings taken first thing in the morning in his notebook to bring to Dr. Rosaura Robinson.   Interventions: . Counseling on indication, mechanism, and side effects of all medications . Insulin titration starting with Lantus . Recommend restarting Lisinopril 2.5mg  for kidney protection, a statin once weekly as patient is non adherent o 11/17/18:  communication sent to Dr. Carlynn Robinson  Patient Self Care Activities:  . Begin taking Lantus 15 units daily . Monitor BG three times daily before meals so we know if we need to adjust mealtime insulin   Please see past updates related to this goal by clicking on the "Past Updates" button in the selected goal          Telephone follow up appointment with CCM team member scheduled for: PharmD 14 days   Larry Robinson, PharmD Clinical Pharmacist [facility] [phone]

## 2018-11-17 NOTE — Patient Instructions (Signed)
Goals Addressed            This Visit's Progress   . I need to understand and afford my medicines (pt-stated)       Current Barriers:  Marland Kitchen Knowledge deficit of diabetes medications and doses . Difficulty affording Trulicity in the past  Pharmacist Clinical Goal(s): Over the next 14 days, patient will take increased dose of Lantus as prescribed by Dr. Carlynn Purl  Updated goal 11/17/18: Over the next 14 days, patient will take the increased dose of Lantus as prescribed by Dr Otelia Sergeant along with an evening snack. Over the next 14 days, patient will record all blood sugar readings taken first thing in the morning in his notebook to bring to Dr. Rosaura Carpenter.   Interventions: . Counseling on indication, mechanism, and side effects of all medications . Insulin titration starting with Lantus . Recommend restarting Lisinopril 2.5mg  for kidney protection, a statin once weekly as patient is non adherent o 11/17/18:  communication sent to Dr. Carlynn Purl  Patient Self Care Activities:  . Begin taking Lantus 15 units daily . Monitor BG three times daily before meals so we know if we need to adjust mealtime insulin   Please see past updates related to this goal by clicking on the "Past Updates" button in the selected goal         The patient verbalized understanding of instructions provided today and declined a print copy of patient instruction materials.

## 2018-12-01 ENCOUNTER — Ambulatory Visit: Payer: Self-pay | Admitting: Pharmacist

## 2018-12-01 ENCOUNTER — Telehealth: Payer: Self-pay

## 2018-12-01 DIAGNOSIS — I10 Essential (primary) hypertension: Secondary | ICD-10-CM

## 2018-12-01 DIAGNOSIS — Z794 Long term (current) use of insulin: Principal | ICD-10-CM

## 2018-12-01 DIAGNOSIS — Z599 Problem related to housing and economic circumstances, unspecified: Secondary | ICD-10-CM

## 2018-12-01 DIAGNOSIS — R809 Proteinuria, unspecified: Principal | ICD-10-CM

## 2018-12-01 DIAGNOSIS — E1129 Type 2 diabetes mellitus with other diabetic kidney complication: Secondary | ICD-10-CM

## 2018-12-01 DIAGNOSIS — Z598 Other problems related to housing and economic circumstances: Secondary | ICD-10-CM

## 2018-12-01 NOTE — Chronic Care Management (AMB) (Signed)
  Chronic Care Management   Note  12/01/2018 Name: Larry Robinson MRN: [id number] DOB: 12/19/1945  73 y.o. year old male referred to Chronic Care Management by Dr. Carlynn Purl for diabetes and diet education and medication assistance. Follow up call today to assess progression of pharmacy goals. Last office visit with Alba Cory, MD was 10/05/18. Last CCM visit was 11/17/18.  Was unable to reach patient via telephone today and have left HIPAA compliant voicemail asking patient to return my call. (unsuccessful outreach #1).  Karalee Height, PharmD Clinical Pharmacist [facility] [phone]

## 2018-12-03 ENCOUNTER — Telehealth: Payer: Self-pay

## 2018-12-03 ENCOUNTER — Ambulatory Visit: Payer: Self-pay | Admitting: Pharmacist

## 2018-12-03 DIAGNOSIS — I10 Essential (primary) hypertension: Secondary | ICD-10-CM

## 2018-12-03 DIAGNOSIS — R809 Proteinuria, unspecified: Principal | ICD-10-CM

## 2018-12-03 DIAGNOSIS — E1129 Type 2 diabetes mellitus with other diabetic kidney complication: Secondary | ICD-10-CM

## 2018-12-03 DIAGNOSIS — Z794 Long term (current) use of insulin: Principal | ICD-10-CM

## 2018-12-03 NOTE — Chronic Care Management (AMB) (Signed)
  Chronic Care Management   Note  12/03/2018 Name: Larry Robinson MRN: [id number] DOB: 18-Nov-1945  73 y.o. year old male referred to Chronic Care Management by Dr. Carlynn Purl for diabetes education and medication assistance. Engaged with CCM pharmacist for follow up regarding new glucometer, medication list changes, and [place] eligibility.   Was unable to reach patient via telephone today and have left HIPAA compliant voicemail asking patient to return my call. (unsuccessful outreach #2).  Karalee Height, PharmD Clinical Pharmacist [facility] [phone]

## 2018-12-08 ENCOUNTER — Telehealth: Payer: Self-pay

## 2018-12-10 ENCOUNTER — Ambulatory Visit: Payer: Self-pay | Admitting: Pharmacist

## 2018-12-10 ENCOUNTER — Telehealth: Payer: Self-pay

## 2018-12-10 DIAGNOSIS — R809 Proteinuria, unspecified: Principal | ICD-10-CM

## 2018-12-10 DIAGNOSIS — Z599 Problem related to housing and economic circumstances, unspecified: Secondary | ICD-10-CM

## 2018-12-10 DIAGNOSIS — Z794 Long term (current) use of insulin: Principal | ICD-10-CM

## 2018-12-10 DIAGNOSIS — Z598 Other problems related to housing and economic circumstances: Secondary | ICD-10-CM

## 2018-12-10 DIAGNOSIS — I10 Essential (primary) hypertension: Secondary | ICD-10-CM

## 2018-12-10 DIAGNOSIS — E1129 Type 2 diabetes mellitus with other diabetic kidney complication: Secondary | ICD-10-CM

## 2018-12-10 NOTE — Chronic Care Management (AMB) (Signed)
  Chronic Care Management   Note  12/10/2018 Name: Larry Robinson MRN: [id number] DOB: 25-Nov-1945  73 y.o. year old male referred to Chronic Care Management by Dr. Alba Cory for medication assistance and diabetes education. Last office visit with Alba Cory, MD was 10/05/18. Follow up call today to assess progression of clinical pharmacy goals, if patient has obtained glucometer, VA eligibility  Was unable to reach patient via telephone today and have left HIPAA compliant voicemail asking patient to return my call. (unsuccessful outreach #3).  Karalee Height, PharmD Clinical Pharmacist [facility] [phone]

## 2018-12-17 ENCOUNTER — Telehealth: Payer: Self-pay

## 2018-12-18 ENCOUNTER — Ambulatory Visit: Payer: Self-pay | Admitting: Pharmacist

## 2018-12-18 ENCOUNTER — Telehealth: Payer: Self-pay

## 2018-12-18 DIAGNOSIS — Z599 Problem related to housing and economic circumstances, unspecified: Secondary | ICD-10-CM

## 2018-12-18 DIAGNOSIS — I10 Essential (primary) hypertension: Secondary | ICD-10-CM

## 2018-12-18 DIAGNOSIS — R809 Proteinuria, unspecified: Principal | ICD-10-CM

## 2018-12-18 DIAGNOSIS — E1129 Type 2 diabetes mellitus with other diabetic kidney complication: Secondary | ICD-10-CM

## 2018-12-18 DIAGNOSIS — Z794 Long term (current) use of insulin: Principal | ICD-10-CM

## 2018-12-18 DIAGNOSIS — Z598 Other problems related to housing and economic circumstances: Secondary | ICD-10-CM

## 2018-12-18 NOTE — Patient Instructions (Signed)
Please call a member of the CCM (Chronic Care Management) Team with any questions or case management needs:   Portia Payne, Rn, BSN Nurse Care Coordinator  [phone]  Jonael Paradiso, PharmD  Clinical Pharmacist  [phone]  Chrystal Land, LCSW Clinical Social Worker [phone]  

## 2018-12-18 NOTE — Chronic Care Management (AMB) (Signed)
    Chronic Care Management   Note  12/18/2018 Name: COZY MCQUADE MRN: [id number] DOB: [birth year]  73 y.o. year old male referred to Chronic Care Management team for diabetes education and medication assistance by Dr. Alba Cory   CCM team services are being closed due to four unsuccessful outreach attempts. Last CCM office visit on 11/17/18.  Patient has been provided CCM contact information if he/she wishes to engage with care managers in the future.    Karalee Height, PharmD Clinical Pharmacist [facility] [phone]

## 2018-12-31 DIAGNOSIS — E1129 Type 2 diabetes mellitus with other diabetic kidney complication: Secondary | ICD-10-CM | POA: Diagnosis not present

## 2018-12-31 DIAGNOSIS — E1165 Type 2 diabetes mellitus with hyperglycemia: Secondary | ICD-10-CM | POA: Diagnosis not present

## 2018-12-31 DIAGNOSIS — E785 Hyperlipidemia, unspecified: Secondary | ICD-10-CM | POA: Diagnosis not present

## 2018-12-31 DIAGNOSIS — F17200 Nicotine dependence, unspecified, uncomplicated: Secondary | ICD-10-CM | POA: Diagnosis not present

## 2018-12-31 DIAGNOSIS — Z794 Long term (current) use of insulin: Secondary | ICD-10-CM | POA: Diagnosis not present

## 2018-12-31 DIAGNOSIS — I10 Essential (primary) hypertension: Secondary | ICD-10-CM | POA: Diagnosis not present

## 2018-12-31 DIAGNOSIS — R809 Proteinuria, unspecified: Secondary | ICD-10-CM | POA: Diagnosis not present

## 2018-12-31 DIAGNOSIS — E1169 Type 2 diabetes mellitus with other specified complication: Secondary | ICD-10-CM | POA: Diagnosis not present

## 2019-02-01 DIAGNOSIS — I10 Essential (primary) hypertension: Secondary | ICD-10-CM | POA: Diagnosis not present

## 2019-02-01 DIAGNOSIS — Z794 Long term (current) use of insulin: Secondary | ICD-10-CM | POA: Diagnosis not present

## 2019-02-01 DIAGNOSIS — E1165 Type 2 diabetes mellitus with hyperglycemia: Secondary | ICD-10-CM | POA: Diagnosis not present

## 2019-02-01 DIAGNOSIS — R809 Proteinuria, unspecified: Secondary | ICD-10-CM | POA: Diagnosis not present

## 2019-02-01 DIAGNOSIS — E1169 Type 2 diabetes mellitus with other specified complication: Secondary | ICD-10-CM | POA: Diagnosis not present

## 2019-02-01 DIAGNOSIS — F17200 Nicotine dependence, unspecified, uncomplicated: Secondary | ICD-10-CM | POA: Diagnosis not present

## 2019-02-01 DIAGNOSIS — E785 Hyperlipidemia, unspecified: Secondary | ICD-10-CM | POA: Diagnosis not present

## 2019-02-01 DIAGNOSIS — E1129 Type 2 diabetes mellitus with other diabetic kidney complication: Secondary | ICD-10-CM | POA: Diagnosis not present

## 2019-02-01 LAB — LIPID PANEL
Cholesterol: 150 (ref 0–200)
HDL: 56 (ref 35–70)
LDL Cholesterol: 80
Triglycerides: 67 (ref 40–160)

## 2019-02-01 LAB — HEMOGLOBIN A1C: Hemoglobin A1C: 6

## 2019-02-01 LAB — BASIC METABOLIC PANEL: Glucose: 128

## 2019-02-05 ENCOUNTER — Ambulatory Visit: Payer: Self-pay

## 2019-02-05 ENCOUNTER — Ambulatory Visit: Payer: Self-pay | Admitting: Family Medicine

## 2019-04-19 ENCOUNTER — Ambulatory Visit: Payer: PPO | Admitting: Urology

## 2019-04-19 ENCOUNTER — Encounter: Payer: Self-pay | Admitting: Urology

## 2019-08-04 ENCOUNTER — Ambulatory Visit: Payer: Self-pay | Admitting: Pharmacist

## 2019-09-08 ENCOUNTER — Ambulatory Visit: Payer: Self-pay | Admitting: Pharmacist

## 2019-09-08 DIAGNOSIS — Z598 Other problems related to housing and economic circumstances: Secondary | ICD-10-CM

## 2019-09-08 DIAGNOSIS — Z794 Long term (current) use of insulin: Secondary | ICD-10-CM

## 2019-09-08 DIAGNOSIS — E1129 Type 2 diabetes mellitus with other diabetic kidney complication: Secondary | ICD-10-CM

## 2019-09-08 DIAGNOSIS — Z599 Problem related to housing and economic circumstances, unspecified: Secondary | ICD-10-CM

## 2019-09-13 NOTE — Chronic Care Management (AMB) (Signed)
  Chronic Care Management   Note  08/05/19- late entry  Name: [name] MRN: [id number] DOB: 10-09-1945  73 y.o. year old male referred to Chronic Care Management by his health plan. Patient previously engaged with CCM team for DM management but was lost to unsuccessful outreach.   Was unable to reach patient via telephone today and have left HIPAA compliant voicemail asking patient to return my call. (unsuccessful outreach #1).  Follow up plan: A HIPPA compliant phone message was left for the patient providing contact information and requesting a return call.  The care management team will reach out to the patient again over the next 14-21 days.   [name], PharmD Clinical Pharmacist [facility] [phone]

## 2019-09-15 ENCOUNTER — Ambulatory Visit: Payer: Self-pay | Admitting: Pharmacist

## 2019-09-15 NOTE — Chronic Care Management (AMB) (Signed)
  Chronic Care Management   Note  09/15/2019 Name: Larry Robinson MRN: [id number] DOB: [birth year]  73 y.o. year old male referred to Chronic Care Management team for medication assistance for 2021 and medication adherence by his health plan.    CCM team services are being closed due to three unsuccessful outreach attempts.   Patient has been provided CCM contact information if he/she wishes to engage with care managers in the future.   [name], PharmD Clinical Pharmacist [facility] [phone]

## 2019-09-15 NOTE — Chronic Care Management (AMB) (Signed)
  Chronic Care Management   Note  08/05/19- late entry  Name: Larry Robinson MRN: [id number] DOB: [birth year]  73 y.o. year old male referred to Chronic Care Management by his health plan. Patient previously engaged with CCM team for DM management but was lost to unsuccessful outreach.   Was unable to reach patient via telephone today and have left HIPAA compliant voicemail asking patient to return my call. (unsuccessful outreach #2).  Follow up plan: A HIPPA compliant phone message was left for the patient providing contact information and requesting a return call.  The care management team will reach out to the patient again over the next 14-21 days.   [name], PharmD Clinical Pharmacist [facility] [phone]

## 2019-10-29 ENCOUNTER — Other Ambulatory Visit: Payer: Self-pay | Admitting: Family Medicine

## 2019-10-29 DIAGNOSIS — Z794 Long term (current) use of insulin: Secondary | ICD-10-CM

## 2019-10-29 DIAGNOSIS — E1129 Type 2 diabetes mellitus with other diabetic kidney complication: Secondary | ICD-10-CM

## 2019-10-29 MED ORDER — METFORMIN HCL 1000 MG PO TABS: 1000.0000 mg | ORAL_TABLET | Freq: Two times a day (BID) | ORAL | 0 refills | Status: DC

## 2019-10-29 NOTE — Telephone Encounter (Signed)
Medication Refill - Medication:  metFORMIN (GLUCOPHAGE) 1000 MG tablet  Has the patient contacted their pharmacy?  Yes advised to call office.  Preferred Pharmacy (with phone number or street name):  [id number] [place], [place] - [address] AT [facility] Phone:  [phone]  Fax:  [phone]     Agent: Please be advised that RX refills may take up to 3 business days. We ask that you follow-up with your pharmacy.

## 2019-11-26 ENCOUNTER — Ambulatory Visit: Payer: PPO | Admitting: Family Medicine

## 2019-12-28 ENCOUNTER — Ambulatory Visit (INDEPENDENT_AMBULATORY_CARE_PROVIDER_SITE_OTHER): Payer: PPO | Admitting: Family Medicine

## 2019-12-28 ENCOUNTER — Other Ambulatory Visit: Payer: Self-pay

## 2019-12-28 ENCOUNTER — Encounter: Payer: Self-pay | Admitting: Family Medicine

## 2019-12-28 VITALS — BP 118/76 | HR 65 | Temp 96.9°F | Resp 16 | Ht 67.0 in | Wt 138.0 lb

## 2019-12-28 DIAGNOSIS — R809 Proteinuria, unspecified: Secondary | ICD-10-CM

## 2019-12-28 DIAGNOSIS — E1129 Type 2 diabetes mellitus with other diabetic kidney complication: Secondary | ICD-10-CM

## 2019-12-28 DIAGNOSIS — I10 Essential (primary) hypertension: Secondary | ICD-10-CM

## 2019-12-28 DIAGNOSIS — E785 Hyperlipidemia, unspecified: Secondary | ICD-10-CM | POA: Diagnosis not present

## 2019-12-28 DIAGNOSIS — Z794 Long term (current) use of insulin: Secondary | ICD-10-CM | POA: Diagnosis not present

## 2019-12-28 DIAGNOSIS — F17200 Nicotine dependence, unspecified, uncomplicated: Secondary | ICD-10-CM | POA: Diagnosis not present

## 2019-12-28 DIAGNOSIS — E1143 Type 2 diabetes mellitus with diabetic autonomic (poly)neuropathy: Secondary | ICD-10-CM | POA: Diagnosis not present

## 2019-12-28 DIAGNOSIS — R350 Frequency of micturition: Secondary | ICD-10-CM | POA: Diagnosis not present

## 2019-12-28 DIAGNOSIS — D582 Other hemoglobinopathies: Secondary | ICD-10-CM | POA: Diagnosis not present

## 2019-12-28 DIAGNOSIS — N401 Enlarged prostate with lower urinary tract symptoms: Secondary | ICD-10-CM | POA: Diagnosis not present

## 2019-12-28 NOTE — Progress Notes (Signed)
Name: Larry Robinson   MRN: [id number]    DOB: 24-May-1946   Date:12/28/2019       Progress Note  Subjective  Chief Complaint  Chief Complaint  Patient presents with  . Medication Refill  . Diabetes    Sees Dr. Pasty Arch  . Benign Prostatic Hypertrophy  . Dyslipidemia    HPI  DMII: denies polyphagia or polyuria, states always feels thirsty. He saw Endo last year, but lost to follow up , last A1C was 01/2019. He would like to go back to Dr. Marcelline Deist, but would like to have labs today . He is still taking Metformin and using Novolog per SS and 10 units of Lantus. He is has hammer toe, diabetic neuropathy, microalbuminuria. He needs to have eye exam , labs, urine micro and follow up with podiatrist. He is is taking simvastatin daily He denies hypoglycemic episodes   HTN off medication because bp dropped. He has microalbuminuria was up to f 100, we may need to put him on ARB. We will recheck urine micro before trying low dose medication  BPH: he was taking flomax but stopped medication and is taking something otc ( Prostate Health ) He states he is doing well.   Dyslipidemia: he is taking simvastatin and denies myalgia. We will recheck labs today.   Elevated HCT: we will recheck labs, still smoking. He is not sure if he snores at night, he denies feeling tired during the day if he sleeps enough during the night.    Patient Active Problem List   Diagnosis Date Noted  . Elevated hemoglobin ([place]) 10/05/2018  . Type 2 diabetes mellitus with microalbuminuria, with long-term current use of insulin ([place]) 10/05/2018  . BPH (benign prostatic hyperplasia) 05/01/2018  . Diabetes mellitus with neuropathy causing erectile dysfunction ([place]) 08/15/2015  . At risk for falling 06/14/2015  . Hypertension 05/17/2015  . Dyslipidemia 05/17/2015  . Current smoker 05/17/2015    Past Surgical History:  Procedure Laterality Date  . COLONOSCOPY WITH PROPOFOL N/A 11/07/2016   Procedure:  COLONOSCOPY WITH PROPOFOL;  Surgeon: Jonathon Bellows, MD;  Location: [facility] ENDOSCOPY;  Service: Endoscopy;  Laterality: N/A;  . COLONOSCOPY WITH PROPOFOL N/A 12/26/2016   Procedure: COLONOSCOPY WITH PROPOFOL;  Surgeon: Jonathon Bellows, MD;  Location: [facility] ENDOSCOPY;  Service: Endoscopy;  Laterality: N/A;  . FOOT SURGERY Right     Family History  Problem Relation Age of Onset  . Hypertension Mother   . Diabetes Father   . Prostate cancer Neg Hx   . Bladder Cancer Neg Hx   . Kidney cancer Neg Hx     Social History   Tobacco Use  . Smoking status: Current Every Day Smoker    Packs/day: 0.75    Years: 53.00    Pack years: 39.75    Types: Cigarettes    Start date: 02/07/1967  . Smokeless tobacco: Never Used  Substance Use Topics  . Alcohol use: Yes    Alcohol/week: 1.0 standard drinks    Types: 1 Glasses of wine per week    Comment: occasional- wine     Current Outpatient Medications:  .  B-D ULTRAFINE III SHORT PEN 31G X 8 MM MISC, TEST BLOOD SUGAR once a day, Disp: 90 each, Rfl: 1 .  BD ULTRA-FINE LANCETS lancets, Use as instructed to check blood glucose three times daily., Disp: 100 each, Rfl: 12 .  blood glucose meter kit and supplies, 1 each by Other route as directed. Dispense based on patient and insurance preference.  Use up to four times daily as directed. (FOR ICD-10 E10.9, E11.9).  ReliOn Meter from [employer], Disp: , Rfl:  .  insulin aspart (NOVOLOG) 100 UNIT/ML FlexPen, Inject into the skin., Disp: , Rfl:  .  insulin glargine (LANTUS SOLOSTAR) 100 UNIT/ML Solostar Pen, Inject into the skin., Disp: , Rfl:  .  metFORMIN (GLUCOPHAGE) 1000 MG tablet, Take 1 tablet (1,000 mg total) by mouth 2 (two) times daily with a meal., Disp: 180 tablet, Rfl: 0 .  Multiple Vitamin (MULTIVITAMIN) tablet, Take 1 tablet by mouth daily., Disp: , Rfl:  .  simvastatin (ZOCOR) 20 MG tablet, Take 1 tablet (20 mg total) by mouth at bedtime., Disp: 90 tablet, Rfl: 1  No Known Allergies  I personally reviewed  active problem list, medication list, allergies, family history, social history, health maintenance with the patient/caregiver today.   ROS  Constitutional: Negative for fever or weight change.  Respiratory: Negative for cough and shortness of breath.   Cardiovascular: Negative for chest pain or palpitations.  Gastrointestinal: Negative for abdominal pain, no bowel changes.  Musculoskeletal: Negative for gait problem or joint swelling.  Skin: Negative for rash.  Neurological: Negative for dizziness or headache.  No other specific complaints in a complete review of systems (except as listed in HPI above).  Objective  Vitals:   12/28/19 0939  BP: 118/76  Pulse: 65  Resp: 16  Temp: (!) 96.9 F (36.1 C)  TempSrc: Temporal  SpO2: 95%  Weight: 138 lb (62.6 kg)  Height: '5\' 7"'  (1.702 m)    Body mass index is 21.61 kg/m.  Physical Exam  Constitutional: Patient appears well-developed and well-nourished.  No distress.  HEENT: head atraumatic, normocephalic, pupils equal and reactive to light Cardiovascular: Normal rate, regular rhythm and normal heart sounds.  No murmur heard. No BLE edema. Pulmonary/Chest: Effort normal and breath sounds normal. No respiratory distress. Abdominal: Soft.  There is no tenderness. Psychiatric: Patient has a normal mood and affect. behavior is normal. Judgment and thought content normal.  Diabetic Foot Exam: Diabetic Foot Exam - Simple   Simple Foot Form Diabetic Foot exam was performed with the following findings: Yes 12/28/2019 10:21 AM  Visual Inspection See comments: Yes Sensation Testing Intact to touch and monofilament testing bilaterally: Yes Pulse Check Posterior Tibialis and Dorsalis pulse intact bilaterally: Yes Comments Hammer toes, thick toenails, 2nd toe right is bruised, but no pain and normal distal pulses, advised follow up with podiatrist      PHQ2/9: Depression screen [facility] 2/9 12/28/2019 10/05/2018 06/04/2018 11/05/2017 07/23/2017   Decreased Interest 0 0 0 0 0  Down, Depressed, Hopeless 0 0 0 0 0  PHQ - 2 Score 0 0 0 0 0  Altered sleeping 0 - - - -  Tired, decreased energy 0 - - - -  Change in appetite 0 - - - -  Feeling bad or failure about yourself  0 - - - -  Trouble concentrating 0 - - - -  Moving slowly or fidgety/restless 0 0 - - -  Suicidal thoughts 0 - - - -  PHQ-9 Score 0 - - - -  Difficult doing work/chores Not difficult at all Not difficult at all - - -    phq 9 is negative   Fall Risk: Fall Risk  12/28/2019 10/05/2018 06/04/2018 05/01/2018 05/01/2018  Falls in the past year? 0 0 No No No  Number falls in past yr: 0 0 - - -  Injury with Fall? 0 0 - - -  Functional Status Survey: Is the patient deaf or have difficulty hearing?: No Does the patient have difficulty seeing, even when wearing glasses/contacts?: No Does the patient have difficulty concentrating, remembering, or making decisions?: No Does the patient have difficulty walking or climbing stairs?: No Does the patient have difficulty dressing or bathing?: No Does the patient have difficulty doing errands alone such as visiting a doctor's office or shopping?: No   Assessment & Plan  1. Type 2 diabetes mellitus with microalbuminuria, with long-term current use of insulin (HCC)  - Microalbumin / creatinine urine ratio - COMPLETE METABOLIC PANEL WITH GFR - Hemoglobin A1c  2. Essential hypertension  - COMPLETE METABOLIC PANEL WITH GFR - CBC with Differential/Platelet  3. Elevated hemoglobin (HCC)  - CBC with Differential/Platelet  4. Type 2 diabetes mellitus with diabetic autonomic neuropathy, with long-term current use of insulin (HCC)  - Hemoglobin A1c  5. Current smoker  Not ready to quit   6. Benign prostatic hyperplasia with urinary frequency  Doing well on otc medication   7. Dyslipidemia  - Lipid panel

## 2019-12-29 LAB — CBC WITH DIFFERENTIAL/PLATELET
Absolute Monocytes: 469 cells/uL (ref 200–950)
Basophils Absolute: 18 cells/uL (ref 0–200)
Basophils Relative: 0.4 %
Eosinophils Absolute: 281 cells/uL (ref 15–500)
Eosinophils Relative: 6.1 %
HCT: 49.5 % (ref 38.5–50.0)
Hemoglobin: 17 g/dL (ref 13.2–17.1)
Lymphs Abs: 1311 cells/uL (ref 850–3900)
MCH: 32.8 pg (ref 27.0–33.0)
MCHC: 34.3 g/dL (ref 32.0–36.0)
MCV: 95.6 fL (ref 80.0–100.0)
MPV: 11.2 fL (ref 7.5–12.5)
Monocytes Relative: 10.2 %
Neutro Abs: 2521 cells/uL (ref 1500–7800)
Neutrophils Relative %: 54.8 %
Platelets: 140 10*3/uL (ref 140–400)
RBC: 5.18 10*6/uL (ref 4.20–5.80)
RDW: 13 % (ref 11.0–15.0)
Total Lymphocyte: 28.5 %
WBC: 4.6 10*3/uL (ref 3.8–10.8)

## 2019-12-29 LAB — LIPID PANEL
Cholesterol: 170 mg/dL (ref <200)
HDL: 75 mg/dL (ref >=40)
LDL Cholesterol (Calc): 82 mg/dL (calc)
Non-HDL Cholesterol (Calc): 95 mg/dL (calc) (ref <130)
Total CHOL/HDL Ratio: 2.3 (calc) (ref <5.0)
Triglycerides: 45 mg/dL (ref <150)

## 2019-12-29 LAB — COMPLETE METABOLIC PANEL WITH GFR
AG Ratio: 1.6 (calc) (ref 1.0–2.5)
ALT: 15 U/L (ref 9–46)
AST: 15 U/L (ref 10–35)
Albumin: 4.1 g/dL (ref 3.6–5.1)
Alkaline phosphatase (APISO): 43 U/L (ref 35–144)
BUN/Creatinine Ratio: 18 (calc) (ref 6–22)
BUN: 21 mg/dL (ref 7–25)
CO2: 32 mmol/L (ref 20–32)
Calcium: 9.7 mg/dL (ref 8.6–10.3)
Chloride: 105 mmol/L (ref 98–110)
Creat: 1.19 mg/dL — ABNORMAL HIGH (ref 0.70–1.18)
GFR, Est African American: 70 mL/min/{1.73_m2} (ref >=60)
GFR, Est Non African American: 61 mL/min/{1.73_m2} (ref >=60)
Globulin: 2.5 g/dL (calc) (ref 1.9–3.7)
Glucose, Bld: 65 mg/dL (ref 65–99)
Potassium: 5 mmol/L (ref 3.5–5.3)
Sodium: 140 mmol/L (ref 135–146)
Total Bilirubin: 0.7 mg/dL (ref 0.2–1.2)
Total Protein: 6.6 g/dL (ref 6.1–8.1)

## 2019-12-29 LAB — HEMOGLOBIN A1C
Hgb A1c MFr Bld: 7 % of total Hgb — ABNORMAL HIGH (ref <5.7)
Mean Plasma Glucose: 154 (calc)
eAG (mmol/L): 8.5 (calc)

## 2019-12-29 LAB — MICROALBUMIN / CREATININE URINE RATIO
Creatinine, Urine: 132 mg/dL (ref 20–320)
Microalb Creat Ratio: 5 mcg/mg creat (ref <30)
Microalb, Ur: 0.7 mg/dL

## 2019-12-29 LAB — PSA: PSA: 0.6 ng/mL (ref <=4.0)

## 2019-12-30 ENCOUNTER — Ambulatory Visit: Payer: PPO | Attending: Internal Medicine

## 2019-12-30 DIAGNOSIS — Z23 Encounter for immunization: Secondary | ICD-10-CM

## 2019-12-30 NOTE — Progress Notes (Signed)
   Covid-19 Vaccination Clinic  Name:  Larry Robinson    MRN: [id number] DOB: [birth year]  12/30/2019  Larry Robinson was observed post Covid-19 immunization for 15 minutes without incident. He was provided with Vaccine Information Sheet and instruction to access the V-Safe system.   Larry Robinson was instructed to call 911 with any severe reactions post vaccine: Marland Kitchen Difficulty breathing  . Swelling of face and throat  . A fast heartbeat  . A bad rash all over body  . Dizziness and weakness   Immunizations Administered    Name Date Dose VIS Date Route   Pfizer COVID-19 Vaccine 12/30/2019  8:39 AM 0.3 mL 09/03/2019 Intramuscular   Manufacturer: [employer], [employer]   Lot: [id number]   NDC: [address]-9

## 2020-01-04 ENCOUNTER — Telehealth: Payer: Self-pay | Admitting: Family Medicine

## 2020-01-04 NOTE — Telephone Encounter (Signed)
Attempted to schedule AWV. Unable to LVM.  Will try at later time. No voicemail. 

## 2020-01-25 ENCOUNTER — Ambulatory Visit: Payer: PPO | Attending: Internal Medicine

## 2020-01-25 DIAGNOSIS — Z23 Encounter for immunization: Secondary | ICD-10-CM

## 2020-01-25 NOTE — Progress Notes (Signed)
   Covid-19 Vaccination Clinic  Name:  Larry Robinson    MRN: [id number] DOB: [birth year]  01/25/2020  [name] was observed post Covid-19 immunization for 15 minutes without incident. He was provided with Vaccine Information Sheet and instruction to access the V-Safe system.   [name] was instructed to call 911 with any severe reactions post vaccine: Marland Kitchen Difficulty breathing  . Swelling of face and throat  . A fast heartbeat  . A bad rash all over body  . Dizziness and weakness   Immunizations Administered    Name Date Dose VIS Date Route   Pfizer COVID-19 Vaccine 01/25/2020  8:46 AM 0.3 mL 11/17/2018 Intramuscular   Manufacturer: [employer], [employer]   Lot: [id number]   NDC: [address]-7

## 2020-01-31 ENCOUNTER — Telehealth: Payer: Self-pay | Admitting: Family Medicine

## 2020-01-31 NOTE — Telephone Encounter (Signed)
Attempted to schedule AWV. Unable to LVM.  Will try at later time. No voicemail setup 

## 2020-02-09 ENCOUNTER — Ambulatory Visit (INDEPENDENT_AMBULATORY_CARE_PROVIDER_SITE_OTHER): Payer: PPO

## 2020-02-09 ENCOUNTER — Ambulatory Visit: Payer: PPO | Admitting: Podiatry

## 2020-02-09 ENCOUNTER — Other Ambulatory Visit: Payer: Self-pay

## 2020-02-09 ENCOUNTER — Encounter: Payer: Self-pay | Admitting: Podiatry

## 2020-02-09 DIAGNOSIS — B351 Tinea unguium: Secondary | ICD-10-CM

## 2020-02-09 DIAGNOSIS — M216X2 Other acquired deformities of left foot: Secondary | ICD-10-CM

## 2020-02-09 DIAGNOSIS — M79676 Pain in unspecified toe(s): Secondary | ICD-10-CM

## 2020-02-09 DIAGNOSIS — M2041 Other hammer toe(s) (acquired), right foot: Secondary | ICD-10-CM | POA: Diagnosis not present

## 2020-02-09 DIAGNOSIS — M216X9 Other acquired deformities of unspecified foot: Secondary | ICD-10-CM

## 2020-02-09 DIAGNOSIS — Q828 Other specified congenital malformations of skin: Secondary | ICD-10-CM

## 2020-02-09 NOTE — Progress Notes (Signed)
Subjective:  Patient ID: GLADYS GUTMAN, male    DOB: May 04, 1946,  MRN: [id number] HPI Chief Complaint  Patient presents with  . Nail Problem    nail and callous trim    He c/o painful callouses bilat feet. "it feels like Im walking on rocks"  He also c/o hammertoe right 2nd toe, no pain, but has callous on tip of toe  . Callouses    74 y.o. male presents with the above complaint.  ROS: Denies fever chills nausea vomiting muscle aches pains calf pain back pain chest pain shortness of breath.  Past Medical History:  Diagnosis Date  . Diabetes mellitus without complication ([place])   . Hyperlipidemia   . Hypertension    Past Surgical History:  Procedure Laterality Date  . COLONOSCOPY WITH PROPOFOL N/A 11/07/2016   Procedure: COLONOSCOPY WITH PROPOFOL;  Surgeon: Jonathon Bellows, MD;  Location: [facility] ENDOSCOPY;  Service: Endoscopy;  Laterality: N/A;  . COLONOSCOPY WITH PROPOFOL N/A 12/26/2016   Procedure: COLONOSCOPY WITH PROPOFOL;  Surgeon: Jonathon Bellows, MD;  Location: [facility] ENDOSCOPY;  Service: Endoscopy;  Laterality: N/A;  . FOOT SURGERY Right     Current Outpatient Medications:  .  B-D ULTRAFINE III SHORT PEN 31G X 8 MM MISC, TEST BLOOD SUGAR once a day, Disp: 90 each, Rfl: 1 .  BD ULTRA-FINE LANCETS lancets, Use as instructed to check blood glucose three times daily., Disp: 100 each, Rfl: 12 .  blood glucose meter kit and supplies, 1 each by Other route as directed. Dispense based on patient and insurance preference. Use up to four times daily as directed. (FOR ICD-10 E10.9, E11.9).  ReliOn Meter from [employer], Disp: , Rfl:  .  insulin aspart (NOVOLOG) 100 UNIT/ML FlexPen, Inject into the skin., Disp: , Rfl:  .  insulin glargine (LANTUS SOLOSTAR) 100 UNIT/ML Solostar Pen, Inject into the skin., Disp: , Rfl:  .  metFORMIN (GLUCOPHAGE) 1000 MG tablet, Take 1 tablet (1,000 mg total) by mouth 2 (two) times daily with a meal., Disp: 180 tablet, Rfl: 0 .  Multiple Vitamin (MULTIVITAMIN) tablet,  Take 1 tablet by mouth daily., Disp: , Rfl:  .  simvastatin (ZOCOR) 20 MG tablet, Take 1 tablet (20 mg total) by mouth at bedtime., Disp: 90 tablet, Rfl: 1  No Known Allergies Review of Systems Objective:  There were no vitals filed for this visit.  General: Well developed, nourished, in no acute distress, alert and oriented x3   Dermatological: Skin is warm, dry and supple bilateral. Nails x 10 are well maintained; remaining integument appears unremarkable at this time. There are no open sores, no preulcerative lesions, no rash or signs of infection present.  Vascular: Dorsalis Pedis artery and Posterior Tibial artery pedal pulses are 2/4 bilateral with immedate capillary fill time. Pedal hair growth present. No varicosities and no lower extremity edema present bilateral.   Neruologic: Grossly intact via light touch bilateral. Vibratory intact via tuning fork bilateral. Protective threshold with Semmes Wienstein monofilament intact to all pedal sites bilateral. Patellar and Achilles deep tendon reflexes 2+ bilateral. No Babinski or clonus noted bilateral.   Musculoskeletal: No gross boney pedal deformities bilateral. No pain, crepitus, or limitation noted with foot and ankle range of motion bilateral. Muscular strength 5/5 in all groups tested bilateral.  Gait: Unassisted, Nonantalgic.    Radiographs:  Radiographs taken today demonstrate fifth metatarsal osteotomy and a first metatarsal osteotomy with K wires retained to the first metatarsal.  Is obviously been a hallux interphalangeal joint fusion on the  right side.  Also demonstrates mild hammertoe deformity.  Plantar fasciitis is also noted soft tissue increase in density plantar pressure calcaneal insertion site.    Assessment & Plan:   Assessment: Pain in limb secondary to onychomycosis and multiple calluses.  History of plantar fasciitis hammertoe deformities metatarsalgia and capsulitis.  Plan: Orthotics were scanned today  debrided nails 1 through 5 bilaterally also debrided all reactive hyperkeratotic tissue.  I will follow-up with him once the orthotics come in if necessary.     Max T. [place], [place]

## 2020-03-01 ENCOUNTER — Ambulatory Visit: Payer: PPO | Admitting: Orthotics

## 2020-03-01 ENCOUNTER — Encounter: Payer: PPO | Admitting: Orthotics

## 2020-03-01 ENCOUNTER — Other Ambulatory Visit: Payer: Self-pay

## 2020-03-01 DIAGNOSIS — M2041 Other hammer toe(s) (acquired), right foot: Secondary | ICD-10-CM

## 2020-03-01 DIAGNOSIS — Q828 Other specified congenital malformations of skin: Secondary | ICD-10-CM

## 2020-03-01 NOTE — Progress Notes (Signed)
Patient came in today to pick up custom made foot orthotics.  The goals were accomplished and the patient reported no dissatisfaction with said orthotics.  Patient was advised of breakin period and how to report any issues. 

## 2020-03-08 ENCOUNTER — Telehealth: Payer: Self-pay | Admitting: Family Medicine

## 2020-03-08 NOTE — Chronic Care Management (AMB) (Signed)
  Chronic Care Management   Note  03/08/2020 Name: ARCADIO COPE MRN: [id number] DOB: 1945-11-17  Ancel Easler Schulte is a 74 y.o. year old male who is a primary care patient of Alba Cory, MD and is actively engaged with the care management team. I reached out to Reynaldo Minium Pelto by phone today to assist with scheduling an initial visit with the Pharmacist.  Follow up plan: A telephone outreach attempt made not able to leave a message. The care management team will reach out to the patient again over the next 7 days. If patient returns call to provider office, please advise to call Embedded Care Management Care Guide Gwenevere Ghazi at [phone].  Gwenevere Ghazi  Care Guide, Embedded Care Coordination [facility]  [place], [place] [address] Direct Dial: [phone] [email or web address] Website: [email or web address]

## 2020-03-09 ENCOUNTER — Telehealth: Payer: Self-pay | Admitting: Family Medicine

## 2020-03-09 ENCOUNTER — Other Ambulatory Visit: Payer: Self-pay | Admitting: Family Medicine

## 2020-03-09 DIAGNOSIS — E1129 Type 2 diabetes mellitus with other diabetic kidney complication: Secondary | ICD-10-CM

## 2020-03-09 MED ORDER — METFORMIN HCL 1000 MG PO TABS: 1000.0000 mg | ORAL_TABLET | Freq: Two times a day (BID) | ORAL | 1 refills | Status: AC

## 2020-03-09 NOTE — Telephone Encounter (Signed)
Tried calling the patient on the only number we have and it just kept ringing. No vm never came on.

## 2020-03-09 NOTE — Telephone Encounter (Signed)
Requested  medications are  due for refill today yes  Requested medications are on the active medication list On list but does not state a dose  Last refill 4/24   Notes to clinic Rx from Dr Rosaura Carpenter, states 5u three x day, current med list does not have a dose.

## 2020-03-09 NOTE — Telephone Encounter (Signed)
Requested medication (s) are due for refill today: yes  Requested medication (s) are on the active medication list: yes  Last refill:  last filled by different provider Warnell Forester, NP  Future visit scheduled: yes  Notes to clinic:  refill last filled by Warnell Forester, NP     Requested Prescriptions  Pending Prescriptions Disp Refills   insulin aspart (NOVOLOG) 100 UNIT/ML FlexPen 15 mL     Sig: Inject into the skin.      Endocrinology:  Diabetes - Insulins Passed - 03/09/2020 10:42 AM      Passed - HBA1C is between 0 and 7.9 and within 180 days    Hemoglobin A1C  Date Value Ref Range Status  02/01/2019 6.0  Final   Hgb A1c MFr Bld  Date Value Ref Range Status  12/28/2019 7.0 (H) <5.7 % of total Hgb Final    Comment:    For someone without known diabetes, a hemoglobin A1c value of 6.5% or greater indicates that they may have  diabetes and this should be confirmed with a follow-up  test. . For someone with known diabetes, a value <7% indicates  that their diabetes is well controlled and a value  greater than or equal to 7% indicates suboptimal  control. A1c targets should be individualized based on  duration of diabetes, age, comorbid conditions, and  other considerations. . Currently, no consensus exists regarding use of hemoglobin A1c for diagnosis of diabetes for children. Renella Cunas - Valid encounter within last 6 months    Recent Outpatient Visits           2 months ago Type 2 diabetes mellitus with microalbuminuria, with long-term current use of insulin [facility])   [facility] [place], Drue Stager, MD   1 year ago Type 2 diabetes mellitus with microalbuminuria, with long-term current use of insulin [facility])   [facility] [place], Drue Stager, MD   1 year ago Diabetes mellitus with neuropathy causing erectile dysfunction [facility])   [facility] Steele Sizer, MD   1 year ago Diabetes mellitus with  neuropathy causing erectile dysfunction [facility])   [place], Bethel Born, NP   1 year ago Diabetes mellitus with neuropathy causing erectile dysfunction [facility])   [place], NP       Future Appointments             In 3 months Ancil Boozer, Drue Stager, MD [facility]             Signed Prescriptions Disp Refills   metFORMIN (GLUCOPHAGE) 1000 MG tablet 180 tablet 1    Sig: Take 1 tablet (1,000 mg total) by mouth 2 (two) times daily with a meal.      Endocrinology:  Diabetes - Biguanides Failed - 03/09/2020 10:42 AM      Failed - Cr in normal range and within 360 days    Creat  Date Value Ref Range Status  12/28/2019 1.19 (H) 0.70 - 1.18 mg/dL Final    Comment:    For patients >82 years of age, the reference limit for Creatinine is approximately 13% higher for people identified as African-American. .    Creatinine, Urine  Date Value Ref Range Status  12/28/2019 132 20 - 320 mg/dL Final          Passed - HBA1C is between 0 and 7.9 and within 180 days  Hemoglobin A1C  Date Value Ref Range Status  02/01/2019 6.0  Final   Hgb A1c MFr Bld  Date Value Ref Range Status  12/28/2019 7.0 (H) <5.7 % of total Hgb Final    Comment:    For someone without known diabetes, a hemoglobin A1c value of 6.5% or greater indicates that they may have  diabetes and this should be confirmed with a follow-up  test. . For someone with known diabetes, a value <7% indicates  that their diabetes is well controlled and a value  greater than or equal to 7% indicates suboptimal  control. A1c targets should be individualized based on  duration of diabetes, age, comorbid conditions, and  other considerations. . Currently, no consensus exists regarding use of hemoglobin A1c for diagnosis of diabetes for children. .           Passed - eGFR in normal range and within 360 days    GFR, Est African American   Date Value Ref Range Status  12/28/2019 70 > OR = 60 mL/min/1.33m Final   GFR, Est Non African American  Date Value Ref Range Status  12/28/2019 61 > OR = 60 mL/min/1.760mFinal          Passed - Valid encounter within last 6 months    Recent Outpatient Visits           2 months ago Type 2 diabetes mellitus with microalbuminuria, with long-term current use of insulin ([facility]   1 year ago Type 2 diabetes mellitus with microalbuminuria, with long-term current use of insulin ([facility]  [facility]ager[facility]   1 year ago Diabetes mellitus with neuropathy causing erectile dysfunction ([facility]  [facility] SizerMD   1 year ago Diabetes mellitus with neuropathy causing erectile dysfunction ([facility]  CH[facility]   1 year ago Diabetes mellitus with neuropathy causing erectile dysfunction ([facility]  [facility] [place]       Future Appointments             In 3 months SoAncil BoozerKrDrue StagerMD [facility]

## 2020-03-09 NOTE — Telephone Encounter (Signed)
Refill Request for Metformin and Novolog Insulin  [id number] [place], [place] - [address] AT [facility]

## 2020-03-10 ENCOUNTER — Other Ambulatory Visit: Payer: Self-pay

## 2020-03-23 ENCOUNTER — Telehealth: Payer: Self-pay | Admitting: Family Medicine

## 2020-03-23 NOTE — Telephone Encounter (Signed)
Copied from CRM [id number]. Topic: Medicare AWV >> Mar 23, 2020 11:29 AM Claudette Laws R wrote: Reason for CRM:  Left message for patient to call back and schedule the Medicare Annual Wellness Visit (AWV) in office or virtual  Last AWV 10/24/2013 per [facility] AWV-I  Please schedule at anytime with [facility].  40 minute appointment

## 2020-03-30 NOTE — Chronic Care Management (AMB) (Signed)
  Chronic Care Management   Note  03/30/2020 Name: Larry Robinson MRN: [id number] DOB: 25-Mar-1946  Larry Robinson is a 74 y.o. year old male who is a primary care patient of Alba Cory, MD and is actively engaged with the care management team. I reached out to Larry Robinson by phone today to assist with scheduling an initial visit with the Pharmacist  Follow up plan: Unsuccessful telephone outreach attempt made. The care management team will reach out to the patient again over the next 7 days. If patient returns call to provider office, please advise to call Embedded Care Management Care Guide Larry Robinson at [phone].  Larry Robinson  Care Guide, Embedded Care Coordination [facility]  [place], [place] [address] Direct Dial: [phone] [email or web address] Website: [email or web address]

## 2020-04-27 DIAGNOSIS — E1129 Type 2 diabetes mellitus with other diabetic kidney complication: Secondary | ICD-10-CM | POA: Diagnosis not present

## 2020-04-27 DIAGNOSIS — F17200 Nicotine dependence, unspecified, uncomplicated: Secondary | ICD-10-CM | POA: Diagnosis not present

## 2020-04-27 DIAGNOSIS — I10 Essential (primary) hypertension: Secondary | ICD-10-CM | POA: Diagnosis not present

## 2020-04-27 DIAGNOSIS — R809 Proteinuria, unspecified: Secondary | ICD-10-CM | POA: Diagnosis not present

## 2020-04-27 DIAGNOSIS — E1169 Type 2 diabetes mellitus with other specified complication: Secondary | ICD-10-CM | POA: Diagnosis not present

## 2020-04-27 DIAGNOSIS — E785 Hyperlipidemia, unspecified: Secondary | ICD-10-CM | POA: Diagnosis not present

## 2020-04-27 DIAGNOSIS — E1165 Type 2 diabetes mellitus with hyperglycemia: Secondary | ICD-10-CM | POA: Diagnosis not present

## 2020-04-27 DIAGNOSIS — Z794 Long term (current) use of insulin: Secondary | ICD-10-CM | POA: Diagnosis not present

## 2020-05-12 ENCOUNTER — Telehealth: Payer: Self-pay | Admitting: Family Medicine

## 2020-05-12 NOTE — Telephone Encounter (Signed)
Copied from CRM [id number]. Topic: Medicare AWV >> May 12, 2020  9:18 AM Claudette Laws R wrote: Reason for CRM:  No answer unable to leave a message for patient to call back and schedule the Medicare Annual Wellness Visit (AWV) in office or virtual  Last AWV 07/07/2017  Please schedule at anytime with [facility] Health Advisor.  40 minute appointment  Any questions, please contact me at [phone]

## 2020-06-07 NOTE — Telephone Encounter (Signed)
Tried to call pt no answer

## 2020-06-07 NOTE — Telephone Encounter (Signed)
I am assuming that the message means he needs appt? He has one for 06-28-2020 and I looked and that is Larry Robinson first avail. Could we give him enough till his appt?

## 2020-06-16 DIAGNOSIS — S01511A Laceration without foreign body of lip, initial encounter: Secondary | ICD-10-CM | POA: Diagnosis not present

## 2020-06-16 DIAGNOSIS — S0083XA Contusion of other part of head, initial encounter: Secondary | ICD-10-CM | POA: Diagnosis not present

## 2020-06-16 DIAGNOSIS — S0990XA Unspecified injury of head, initial encounter: Secondary | ICD-10-CM | POA: Diagnosis not present

## 2020-06-16 DIAGNOSIS — R6884 Jaw pain: Secondary | ICD-10-CM | POA: Diagnosis not present

## 2020-06-16 DIAGNOSIS — W19XXXA Unspecified fall, initial encounter: Secondary | ICD-10-CM | POA: Diagnosis not present

## 2020-06-16 DIAGNOSIS — S0003XA Contusion of scalp, initial encounter: Secondary | ICD-10-CM | POA: Diagnosis not present

## 2020-06-16 DIAGNOSIS — S0101XA Laceration without foreign body of scalp, initial encounter: Secondary | ICD-10-CM | POA: Diagnosis not present

## 2020-06-17 DIAGNOSIS — S0083XA Contusion of other part of head, initial encounter: Secondary | ICD-10-CM | POA: Diagnosis not present

## 2020-06-17 DIAGNOSIS — S0003XA Contusion of scalp, initial encounter: Secondary | ICD-10-CM | POA: Diagnosis not present

## 2020-06-21 DIAGNOSIS — E1165 Type 2 diabetes mellitus with hyperglycemia: Secondary | ICD-10-CM | POA: Diagnosis not present

## 2020-06-21 DIAGNOSIS — E1129 Type 2 diabetes mellitus with other diabetic kidney complication: Secondary | ICD-10-CM | POA: Diagnosis not present

## 2020-06-21 DIAGNOSIS — R809 Proteinuria, unspecified: Secondary | ICD-10-CM | POA: Diagnosis not present

## 2020-06-21 DIAGNOSIS — E785 Hyperlipidemia, unspecified: Secondary | ICD-10-CM | POA: Diagnosis not present

## 2020-06-21 DIAGNOSIS — Z794 Long term (current) use of insulin: Secondary | ICD-10-CM | POA: Diagnosis not present

## 2020-06-21 DIAGNOSIS — I10 Essential (primary) hypertension: Secondary | ICD-10-CM | POA: Diagnosis not present

## 2020-06-21 DIAGNOSIS — E1169 Type 2 diabetes mellitus with other specified complication: Secondary | ICD-10-CM | POA: Diagnosis not present

## 2020-06-21 DIAGNOSIS — F17200 Nicotine dependence, unspecified, uncomplicated: Secondary | ICD-10-CM | POA: Diagnosis not present

## 2020-06-26 NOTE — Progress Notes (Signed)
Name: Larry Robinson   MRN: [id number]    DOB: 1946-05-04   Date:06/28/2020       Progress Note  Subjective  Chief Complaint  Chief Complaint  Patient presents with  . Diabetes  . Hypertension  . Dyslipidemia    HPI  DMII: denies polyphagiaor polyuria, states always feels thirsty.Last A1C was 04/2020 at [facility] and it was 8.6 % . He is still taking Metformin 1000 mg  and using Novolog 5 units before breakfast and dinner and was up to 14 units of lantus but had a hypoglycemic episode that caused him to fall at work and dose was adjusted to 10 units daily  He is has hammer toe, diabetic neuropathy, microalbuminuria that has resolved . He has an eye exam scheduled   HTN off medication because bp dropped.He had urine micro as high as  100 in the past , but last one was 5. He is not on medications , however bp today is up to 140 SBP we will try lisinopril 2.5 mg daily and monitor   BPH: he was taking flomax but stopped medication and is taking something otc ( Prostate Health ) He states he is doing well. We will check PSA yearly    Dyslipidemia: he is taking simvastatin and denies myalgia.Reviewed labs, denies myalgia , refill of medication sent to pharmacy   Thrombocytopenia: found on labs done at [facility] 06/17/2020 , we will recheck next visit.  Hemoglobin was back to normal   Recent fall: happened at work, on 06/17/2020, he was carrying a tape machine and going down the stairs, he had felt symptoms of hypoglycemia a little before ( he cannot describe the feeling - but he knew he had to eat), he states lost his balance and feel head first, he was taken to [facility] and had staples placed on scalp, he was also having left shoulder pain, and had to eat soft food because of jaw pain. He is feeling well , he was told to have staples removed here   Patient Active Problem List   Diagnosis Date Noted  . Diabetic polyneuropathy associated with type 2 diabetes mellitus ([place]) 10/28/2018  .  Tobacco dependency 10/28/2018  . Elevated hemoglobin ([place]) 10/05/2018  . Type 2 diabetes mellitus with microalbuminuria, with long-term current use of insulin ([place]) 10/05/2018  . BPH (benign prostatic hyperplasia) 05/01/2018  . Diabetes mellitus with neuropathy causing erectile dysfunction ([place]) 08/15/2015  . At risk for falling 06/14/2015  . Hypertension 05/17/2015  . Dyslipidemia 05/17/2015  . Current smoker 05/17/2015    Past Surgical History:  Procedure Laterality Date  . COLONOSCOPY WITH PROPOFOL N/A 11/07/2016   Procedure: COLONOSCOPY WITH PROPOFOL;  Surgeon: Jonathon Bellows, MD;  Location: [facility] ENDOSCOPY;  Service: Endoscopy;  Laterality: N/A;  . COLONOSCOPY WITH PROPOFOL N/A 12/26/2016   Procedure: COLONOSCOPY WITH PROPOFOL;  Surgeon: Jonathon Bellows, MD;  Location: [facility] ENDOSCOPY;  Service: Endoscopy;  Laterality: N/A;  . FOOT SURGERY Right     Family History  Problem Relation Age of Onset  . Hypertension Mother   . Diabetes Father   . Prostate cancer Neg Hx   . Bladder Cancer Neg Hx   . Kidney cancer Neg Hx     Social History   Tobacco Use  . Smoking status: Current Every Day Smoker    Packs/day: 0.75    Years: 53.00    Pack years: 39.75    Types: Cigarettes    Start date: 02/07/1967  . Smokeless tobacco: Never  Used  Substance Use Topics  . Alcohol use: Yes    Alcohol/week: 1.0 standard drink    Types: 1 Glasses of wine per week    Comment: occasional- wine     Current Outpatient Medications:  .  B-D ULTRAFINE III SHORT PEN 31G X 8 MM MISC, TEST BLOOD SUGAR once a day, Disp: 90 each, Rfl: 1 .  BD ULTRA-FINE LANCETS lancets, Use as instructed to check blood glucose three times daily., Disp: 100 each, Rfl: 12 .  blood glucose meter kit and supplies, 1 each by Other route as directed. Dispense based on patient and insurance preference. Use up to four times daily as directed. (FOR ICD-10 E10.9, E11.9).  ReliOn Meter from [employer], Disp: , Rfl:  .  insulin aspart (NOVOLOG)  100 UNIT/ML FlexPen, Inject into the skin., Disp: , Rfl:  .  insulin glargine (LANTUS SOLOSTAR) 100 UNIT/ML Solostar Pen, Inject into the skin., Disp: , Rfl:  .  metFORMIN (GLUCOPHAGE) 1000 MG tablet, Take 1 tablet (1,000 mg total) by mouth 2 (two) times daily with a meal., Disp: 180 tablet, Rfl: 1 .  Multiple Vitamin (MULTIVITAMIN) tablet, Take 1 tablet by mouth daily., Disp: , Rfl:  .  simvastatin (ZOCOR) 20 MG tablet, Take 1 tablet (20 mg total) by mouth at bedtime., Disp: 90 tablet, Rfl: 1 .  ONETOUCH ULTRA test strip, 2 (two) times daily., Disp: , Rfl:   No Known Allergies  I personally reviewed active problem list, medication list, allergies, family history, social history, health maintenance with the patient/caregiver today.   ROS  Constitutional: Negative for fever or weight change.  Respiratory: Negative for cough and shortness of breath.   Cardiovascular: Negative for chest pain or palpitations.  Gastrointestinal: Negative for abdominal pain, no bowel changes.  Musculoskeletal: Negative for gait problem or joint swelling.  Skin: Negative for rash.  Neurological: Negative for dizziness or headache.  No other specific complaints in a complete review of systems (except as listed in HPI above).  Objective  Vitals:   06/28/20 0919  BP: 140/76  Pulse: 60  Resp: 16  Temp: 98.1 F (36.7 C)  TempSrc: Oral  SpO2: 100%  Weight: 129 lb 3.2 oz (58.6 kg)  Height: '5\' 7"'  (1.702 m)    Body mass index is 20.24 kg/m.  Physical Exam  Constitutional: Patient appears well-developed and well-nourished. No distress.  HEENT: head atraumatic, normocephalic, pupils equal and reactive to light,  neck supple Cardiovascular: Normal rate, regular rhythm and normal heart sounds.  No murmur heard. No BLE edema. Pulmonary/Chest: Effort normal and breath sounds normal. No respiratory distress. Abdominal: Soft.  There is no tenderness. Skin: ecchymosis around right eye from recent fall, staples  on scalp  Psychiatric: Patient has a normal mood and affect. behavior is normal. Judgment and thought content normal.  PHQ2/9: Depression screen [facility] 2/9 06/28/2020 12/28/2019 10/05/2018 06/04/2018 11/05/2017  Decreased Interest 0 0 0 0 0  Down, Depressed, Hopeless 0 0 0 0 0  PHQ - 2 Score 0 0 0 0 0  Altered sleeping - 0 - - -  Tired, decreased energy - 0 - - -  Change in appetite - 0 - - -  Feeling bad or failure about yourself  - 0 - - -  Trouble concentrating - 0 - - -  Moving slowly or fidgety/restless - 0 0 - -  Suicidal thoughts - 0 - - -  PHQ-9 Score - 0 - - -  Difficult doing work/chores - Not difficult  at all Not difficult at all - -    phq 9 is negative   Fall Risk: Fall Risk  06/28/2020 12/28/2019 10/05/2018 06/04/2018 05/01/2018  Falls in the past year? 1 0 0 No No  Number falls in past yr: 0 0 0 - -  Injury with Fall? 1 0 0 - -     Functional Status Survey: Is the patient deaf or have difficulty hearing?: No Does the patient have difficulty seeing, even when wearing glasses/contacts?: No Does the patient have difficulty concentrating, remembering, or making decisions?: No Does the patient have difficulty walking or climbing stairs?: No Does the patient have difficulty dressing or bathing?: No Does the patient have difficulty doing errands alone such as visiting a doctor's office or shopping?: No    Assessment & Plan  1. Thrombocytopenia ([place])  Recheck next visit   2. Essential hypertension  - lisinopril (ZESTRIL) 2.5 MG tablet; Take 1 tablet (2.5 mg total) by mouth daily.  Dispense: 90 tablet; Refill: 1 Resuming medication due to microalbuminuria   3. Type 2 diabetes mellitus with diabetic autonomic neuropathy, with long-term current use of insulin ([place])   4. Need for immunization against influenza  - Flu Vaccine QUAD High Dose(Fluad)  5. Type 2 diabetes mellitus with microalbuminuria, with long-term current use of insulin (H[facility])  - lisinopril (ZESTRIL) 2.5 MG  tablet; Take 1 tablet (2.5 mg total) by mouth daily.  Dispense: 90 tablet; Refill: 1  6. Dyslipidemia  - simvastatin (ZOCOR) 20 MG tablet; Take 1 tablet (20 mg total) by mouth at bedtime.  Dispense: 90 tablet; Refill: 1  7. Benign prostatic hyperplasia with urinary frequency   8. Hypoglycemic episode in patient with diabetes mellitus ([place])  That caused recent fall  9. History of recent fall   10. Laceration of skin of scalp, subsequent encounter   11. Visit for suture removal  Area cleaned, patient gave verbal consent, staples removed without complications

## 2020-06-28 ENCOUNTER — Ambulatory Visit (INDEPENDENT_AMBULATORY_CARE_PROVIDER_SITE_OTHER): Payer: PPO | Admitting: Family Medicine

## 2020-06-28 ENCOUNTER — Other Ambulatory Visit: Payer: Self-pay

## 2020-06-28 ENCOUNTER — Encounter: Payer: Self-pay | Admitting: Family Medicine

## 2020-06-28 VITALS — BP 140/76 | HR 60 | Temp 98.1°F | Resp 16 | Ht 67.0 in | Wt 129.2 lb

## 2020-06-28 DIAGNOSIS — Z794 Long term (current) use of insulin: Secondary | ICD-10-CM | POA: Diagnosis not present

## 2020-06-28 DIAGNOSIS — I10 Essential (primary) hypertension: Secondary | ICD-10-CM | POA: Diagnosis not present

## 2020-06-28 DIAGNOSIS — E1143 Type 2 diabetes mellitus with diabetic autonomic (poly)neuropathy: Secondary | ICD-10-CM

## 2020-06-28 DIAGNOSIS — R809 Proteinuria, unspecified: Secondary | ICD-10-CM

## 2020-06-28 DIAGNOSIS — Z9181 History of falling: Secondary | ICD-10-CM

## 2020-06-28 DIAGNOSIS — N401 Enlarged prostate with lower urinary tract symptoms: Secondary | ICD-10-CM

## 2020-06-28 DIAGNOSIS — R350 Frequency of micturition: Secondary | ICD-10-CM

## 2020-06-28 DIAGNOSIS — Z23 Encounter for immunization: Secondary | ICD-10-CM

## 2020-06-28 DIAGNOSIS — D696 Thrombocytopenia, unspecified: Secondary | ICD-10-CM | POA: Diagnosis not present

## 2020-06-28 DIAGNOSIS — E1129 Type 2 diabetes mellitus with other diabetic kidney complication: Secondary | ICD-10-CM

## 2020-06-28 DIAGNOSIS — Z4802 Encounter for removal of sutures: Secondary | ICD-10-CM

## 2020-06-28 DIAGNOSIS — E785 Hyperlipidemia, unspecified: Secondary | ICD-10-CM

## 2020-06-28 DIAGNOSIS — E11649 Type 2 diabetes mellitus with hypoglycemia without coma: Secondary | ICD-10-CM

## 2020-06-28 DIAGNOSIS — S0101XD Laceration without foreign body of scalp, subsequent encounter: Secondary | ICD-10-CM

## 2020-06-28 MED ORDER — SIMVASTATIN 20 MG PO TABS: 20.0000 mg | ORAL_TABLET | Freq: Every day | ORAL | 1 refills | Status: AC

## 2020-06-28 MED ORDER — LISINOPRIL 2.5 MG PO TABS: 2.5000 mg | ORAL_TABLET | Freq: Every day | ORAL | 1 refills | Status: AC

## 2020-06-28 NOTE — Patient Instructions (Signed)

## 2020-07-07 ENCOUNTER — Telehealth: Payer: Self-pay | Admitting: Family Medicine

## 2020-07-07 NOTE — Telephone Encounter (Signed)
Copied from CRM [id number]. Topic: Medicare AWV >> Jul 07, 2020  9:54 AM Claudette Laws R wrote: Reason for CRM:  Left message for patient to call back and schedule the Medicare Annual Wellness Visit (AWV) in office or virtual  Last AWV 07/07/2017  Please schedule at anytime with [facility] Health Advisor.  40 minute appointment  Any questions, please contact me at [phone]

## 2020-07-14 ENCOUNTER — Telehealth: Payer: Self-pay | Admitting: *Deleted

## 2020-07-14 NOTE — Chronic Care Management (AMB) (Signed)
  Chronic Care Management   Outreach Note  07/14/2020 Name: [name] MRN: [id number] DOB: 12-19-1945  Larry Robinson is a 74 y.o. year old male who is a primary care patient of Alba Cory, MD. I reached out to Reynaldo Minium Lucchetti by phone today in response to a referral sent by Mr. Ewart Carrera [employer]'s health plan.     An unsuccessful telephone outreach was attempted today. The patient was referred to the case management team for assistance with care management and care coordination.   Follow Up Plan: A HIPAA compliant phone message was left for the patient providing contact information and requesting a return call. The care management team will reach out to the patient again over the next 7-12 days. If patient returns call to provider office, please advise to call Embedded Care Management Care Guide Gwenevere Ghazi at [phone].  Gwenevere Ghazi  Care Guide, Embedded Care Coordination [facility] Management

## 2020-07-25 NOTE — Chronic Care Management (AMB) (Signed)
  Chronic Care Management   Outreach Note  07/25/2020 Name: Larry Robinson MRN: [id number] DOB: 1946/06/21  Larry Robinson is a 74 y.o. year old male who is a primary care patient of Alba Cory, MD. I reached out to Larry Robinson by phone today in response to a referral sent by Larry Robinson's health plan.     A second unsuccessful telephone outreach was attempted today. The patient was referred to the case management team for assistance with care management and care coordination.   Follow Up Plan: The care management team will reach out to the patient again over the next 7 days. If patient returns call to provider office, please advise to call Embedded Care Management Care Guide Gwenevere Ghazi at [phone].  Gwenevere Ghazi  Care Guide, Embedded Care Coordination [facility] Management

## 2020-07-31 NOTE — Chronic Care Management (AMB) (Signed)
  Chronic Care Management   Note  07/31/2020 Name: MELANIE PELLOT MRN: [id number] DOB: 09-11-1946  Kingdavid Leinbach Blinn is a 74 y.o. year old male who is a primary care patient of Steele Sizer, MD. I reached out to Renne Crigler Coberly by phone today in response to a referral sent by Mr. Angelino Rumery Adcox's health plan.     Mr. Mangione was given information about Chronic Care Management services today including:  1. CCM service includes personalized support from designated clinical staff supervised by his physician, including individualized plan of care and coordination with other care providers 2. 24/7 contact phone numbers for assistance for urgent and routine care needs. 3. Service will only be billed when office clinical staff spend 20 minutes or more in a month to coordinate care. 4. Only one practitioner may furnish and bill the service in a calendar month. 5. The patient may stop CCM services at any time (effective at the end of the month) by phone call to the office staff. 6. The patient will be responsible for cost sharing (co-pay) of up to 20% of the service fee (after annual deductible is met).  Patient agreed to services and verbal consent obtained.   Follow up plan: Telephone appointment with care management team member scheduled for: 08/07/2020  [place] Management  Direct Dial: [phone]

## 2020-08-07 ENCOUNTER — Telehealth: Payer: PPO

## 2020-08-07 ENCOUNTER — Telehealth: Payer: Self-pay

## 2020-08-07 NOTE — Telephone Encounter (Signed)
°  Chronic Care Management   Outreach Note  08/07/2020 Name: Larry Robinson MRN: [id number] DOB: [birth year]  Primary Care Provider: Alba Cory, MD Reason for referral : Chronic Care Management   An unsuccessful telephone outreach was attempted today. [name] was referred to the case management team for assistance with care management and care coordination.   Phone rang multiple times without option to leave a voice message.   Follow Up Plan: A member of the care management team will reach out to [name] again within the next two weeks.    [facility] Care Management [facility] [phone]

## 2020-08-21 ENCOUNTER — Telehealth: Payer: Self-pay

## 2020-08-21 NOTE — Telephone Encounter (Signed)
°  Chronic Care Management   Outreach Note  08/21/2020 Name: Larry Robinson MRN: [id number] DOB: Jul 02, 1946     Primary Care Provider: Alba Cory, MD Reason for referral : Chronic Care Management   A second unsuccessful telephone outreach was attempted today. [name] was referred to the case management team for assistance with care management and care coordination.   Phone rang multiple times again today without an option to leave a voice message.    Follow Up Plan:  A member of the care management team will reach out to [name] again within the next two weeks.    [facility] Care Management [facility] [phone]

## 2020-11-27 ENCOUNTER — Ambulatory Visit: Payer: Self-pay

## 2020-11-27 NOTE — Chronic Care Management (AMB) (Signed)
  Chronic Care Management   Outreach Note  11/27/2020 Name: Larry Robinson MRN: [id number] DOB: 12-14-1945  Primary Care Provider: Alba Cory, MD Reason for referral : Chronic Care Management   Mr. Tomczak was referred to the care management team for assistance with chronic care management and care coordination. His primary care provider will be notified of our unsuccessful attempts to maintain contact. The care management team will gladly outreach at any time in the future if he is interested in receiving assistance.   PLAN The care management team will gladly follow up with Mr. Vanscyoc after the primary care provider has a conversation with him regarding recommendation for care management engagement and subsequent re-referral for care management services.    [facility] Care Management [facility] [phone]

## 2020-12-26 DIAGNOSIS — L84 Corns and callosities: Secondary | ICD-10-CM | POA: Insufficient documentation

## 2020-12-26 DIAGNOSIS — M2040 Other hammer toe(s) (acquired), unspecified foot: Secondary | ICD-10-CM | POA: Insufficient documentation

## 2020-12-26 DIAGNOSIS — E7800 Pure hypercholesterolemia, unspecified: Secondary | ICD-10-CM | POA: Insufficient documentation

## 2020-12-26 NOTE — Progress Notes (Deleted)
Name: Larry Robinson   MRN: [id number]    DOB: [birth year]   Date:12/26/2020       Progress Note  Subjective  Chief Complaint  Follow up   HPI DMII: denies polyphagiaor polyuria, states always feels thirsty.Last A1C was 04/2020 at [facility] and it was 8.6 % . He is still taking Metformin 1000 mg  and using Novolog 5 units before breakfast and dinner and was up to 14 units of lantus but had a hypoglycemic episode that caused him to fall at work and dose was adjusted to 10 units daily  He is has hammer toe, diabetic neuropathy, microalbuminuria that has resolved . He has an eye exam scheduled   HTN off medication because bp dropped.He had urine micro as high as  100 in the past , but last one was 5. He is not on medications , however bp today is up to 140 SBP we will try lisinopril 2.5 mg daily and monitor   BPH: he was taking flomax but stopped medication and is taking something otc ( [facility] ) He states he is doing well. We will check PSA yearly    Dyslipidemia: he is taking simvastatin and denies myalgia.Reviewed labs, denies myalgia , refill of medication sent to pharmacy   Thrombocytopenia: found on labs done at [facility] 06/17/2020 , we will recheck next visit.  Hemoglobin was back to normal   Recent fall: happened at work, on 06/17/2020, he was carrying a tape machine and going down the stairs, he had felt symptoms of hypoglycemia a little before ( he cannot describe the feeling - but he knew he had to eat), he states lost his balance and feel head first, he was taken to [facility] and had staples placed on scalp, he was also having left shoulder pain, and had to eat soft food because of jaw pain. He is feeling well , he was told to have staples removed here   *** Patient Active Problem List   Diagnosis Date Noted  . Callus 12/26/2020  . Hammer toe 12/26/2020  . Hypercholesterolemia 12/26/2020  . Diabetic polyneuropathy associated with type 2 diabetes mellitus ([place])  10/28/2018  . Tobacco dependency 10/28/2018  . Elevated hemoglobin ([place]) 10/05/2018  . Type 2 diabetes mellitus with microalbuminuria, with long-term current use of insulin ([place]) 10/05/2018  . BPH (benign prostatic hyperplasia) 05/01/2018  . Diabetes mellitus with neuropathy causing erectile dysfunction ([place]) 08/15/2015  . At risk for falling 06/14/2015  . Hypertension 05/17/2015  . Dyslipidemia 05/17/2015  . Current smoker 05/17/2015    Past Surgical History:  Procedure Laterality Date  . COLONOSCOPY WITH PROPOFOL N/A 11/07/2016   Procedure: COLONOSCOPY WITH PROPOFOL;  Surgeon: Jonathon Bellows, MD;  Location: [facility] ENDOSCOPY;  Service: Endoscopy;  Laterality: N/A;  . COLONOSCOPY WITH PROPOFOL N/A 12/26/2016   Procedure: COLONOSCOPY WITH PROPOFOL;  Surgeon: Jonathon Bellows, MD;  Location: [facility] ENDOSCOPY;  Service: Endoscopy;  Laterality: N/A;  . FOOT SURGERY Right     Family History  Problem Relation Age of Onset  . Hypertension Mother   . Diabetes Father   . Prostate cancer Neg Hx   . Bladder Cancer Neg Hx   . Kidney cancer Neg Hx     Social History   Tobacco Use  . Smoking status: Current Every Day Smoker    Packs/day: 0.75    Years: 53.00    Pack years: 39.75    Types: Cigarettes    Start date: 02/07/1967  . Smokeless tobacco: Never Used  Substance Use Topics  . Alcohol use: Yes    Alcohol/week: 1.0 standard drink    Types: 1 Glasses of wine per week    Comment: occasional- wine     Current Outpatient Medications:  .  B-D ULTRAFINE III SHORT PEN 31G X 8 MM MISC, TEST BLOOD SUGAR once a day, Disp: 90 each, Rfl: 1 .  BD ULTRA-FINE LANCETS lancets, Use as instructed to check blood glucose three times daily., Disp: 100 each, Rfl: 12 .  blood glucose meter kit and supplies, 1 each by Other route as directed. Dispense based on patient and insurance preference. Use up to four times daily as directed. (FOR ICD-10 E10.9, E11.9).  ReliOn Meter from [employer], Disp: , Rfl:  .  insulin  aspart (NOVOLOG) 100 UNIT/ML FlexPen, Inject into the skin., Disp: , Rfl:  .  insulin glargine (LANTUS SOLOSTAR) 100 UNIT/ML Solostar Pen, Inject into the skin., Disp: , Rfl:  .  lisinopril (ZESTRIL) 2.5 MG tablet, Take 1 tablet (2.5 mg total) by mouth daily., Disp: 90 tablet, Rfl: 1 .  metFORMIN (GLUCOPHAGE) 1000 MG tablet, Take 1 tablet (1,000 mg total) by mouth 2 (two) times daily with a meal., Disp: 180 tablet, Rfl: 1 .  MODERNA COVID-19 VACCINE 100 MCG/0.5ML injection, , Disp: , Rfl:  .  Multiple Vitamin (MULTIVITAMIN) tablet, Take 1 tablet by mouth daily., Disp: , Rfl:  .  ONETOUCH ULTRA test strip, 2 (two) times daily., Disp: , Rfl:  .  simvastatin (ZOCOR) 20 MG tablet, Take 1 tablet (20 mg total) by mouth at bedtime., Disp: 90 tablet, Rfl: 1  No Known Allergies  I personally reviewed {Reviewed:[id number]} with the patient/caregiver today.   ROS  ***  Objective  There were no vitals filed for this visit.  There is no height or weight on file to calculate BMI.  Physical Exam ***  No results found for this or any previous visit (from the past 2160 hour(s)).  Diabetic Foot Exam: Diabetic Foot Exam - Simple   No data filed    ***  PHQ2/9: Depression screen [facility] 2/9 06/28/2020 12/28/2019 10/05/2018 06/04/2018 11/05/2017  Decreased Interest 0 0 0 0 0  Down, Depressed, Hopeless 0 0 0 0 0  PHQ - 2 Score 0 0 0 0 0  Altered sleeping - 0 - - -  Tired, decreased energy - 0 - - -  Change in appetite - 0 - - -  Feeling bad or failure about yourself  - 0 - - -  Trouble concentrating - 0 - - -  Moving slowly or fidgety/restless - 0 0 - -  Suicidal thoughts - 0 - - -  PHQ-9 Score - 0 - - -  Difficult doing work/chores - Not difficult at all Not difficult at all - -    phq 9 is {gen pos [id number]} ***  Fall Risk: Fall Risk  06/28/2020 12/28/2019 10/05/2018 06/04/2018 05/01/2018  Falls in the past year? 1 0 0 No No  Number falls in past yr: 0 0 0 - -  Injury with Fall? 1 0 0 - -    ***   Functional Status Survey:   ***   Assessment & Plan  *** There are no diagnoses linked to this encounter.

## 2020-12-27 ENCOUNTER — Ambulatory Visit: Payer: PPO | Admitting: Family Medicine

## 2020-12-27 DIAGNOSIS — Z794 Long term (current) use of insulin: Secondary | ICD-10-CM

## 2021-01-04 DIAGNOSIS — E1129 Type 2 diabetes mellitus with other diabetic kidney complication: Secondary | ICD-10-CM | POA: Diagnosis not present

## 2021-01-04 DIAGNOSIS — I10 Essential (primary) hypertension: Secondary | ICD-10-CM | POA: Diagnosis not present

## 2021-01-04 DIAGNOSIS — F17200 Nicotine dependence, unspecified, uncomplicated: Secondary | ICD-10-CM | POA: Diagnosis not present

## 2021-01-04 DIAGNOSIS — E1165 Type 2 diabetes mellitus with hyperglycemia: Secondary | ICD-10-CM | POA: Diagnosis not present

## 2021-01-04 DIAGNOSIS — Z794 Long term (current) use of insulin: Secondary | ICD-10-CM | POA: Diagnosis not present

## 2021-01-04 DIAGNOSIS — E1169 Type 2 diabetes mellitus with other specified complication: Secondary | ICD-10-CM | POA: Diagnosis not present

## 2021-01-04 DIAGNOSIS — R809 Proteinuria, unspecified: Secondary | ICD-10-CM | POA: Diagnosis not present

## 2021-01-04 DIAGNOSIS — E785 Hyperlipidemia, unspecified: Secondary | ICD-10-CM | POA: Diagnosis not present

## 2021-04-16 DIAGNOSIS — E1169 Type 2 diabetes mellitus with other specified complication: Secondary | ICD-10-CM | POA: Diagnosis not present

## 2021-04-16 DIAGNOSIS — F17200 Nicotine dependence, unspecified, uncomplicated: Secondary | ICD-10-CM | POA: Diagnosis not present

## 2021-04-16 DIAGNOSIS — E785 Hyperlipidemia, unspecified: Secondary | ICD-10-CM | POA: Diagnosis not present

## 2021-04-16 DIAGNOSIS — I10 Essential (primary) hypertension: Secondary | ICD-10-CM | POA: Diagnosis not present

## 2021-04-16 DIAGNOSIS — E1129 Type 2 diabetes mellitus with other diabetic kidney complication: Secondary | ICD-10-CM | POA: Diagnosis not present

## 2021-04-16 DIAGNOSIS — E1165 Type 2 diabetes mellitus with hyperglycemia: Secondary | ICD-10-CM | POA: Diagnosis not present

## 2021-04-16 DIAGNOSIS — R809 Proteinuria, unspecified: Secondary | ICD-10-CM | POA: Diagnosis not present

## 2021-04-16 DIAGNOSIS — Z794 Long term (current) use of insulin: Secondary | ICD-10-CM | POA: Diagnosis not present

## 2021-05-03 ENCOUNTER — Telehealth: Payer: Self-pay | Admitting: Family Medicine

## 2021-05-03 NOTE — Telephone Encounter (Signed)
Copied from CRM [id number]. Topic: Medicare AWV >> May 03, 2021 11:14 AM Claudette Laws R wrote: Reason for CRM:  Tried contacting patient's brother Peyton Najjar) to get a good phone number.  No answer-

## 2021-05-11 ENCOUNTER — Telehealth: Payer: Self-pay | Admitting: Family Medicine

## 2021-05-11 NOTE — Telephone Encounter (Signed)
Copied from CRM [id number]. Topic: Medicare AWV >> May 11, 2021 10:54 AM Claudette Laws R wrote: Reason for CRM:  Number not in service unable to leave a message for patient to call back and schedule Medicare Annual Wellness Visit (AWV) in office.   If unable to come into the office for AWV,  please offer to do virtually or by telephone.  Last AWV: 07/07/2017  Please schedule at anytime with [facility].  40 minute appointment  Any questions, please contact me at [phone]

## 2021-07-19 ENCOUNTER — Telehealth: Payer: Self-pay

## 2021-07-19 NOTE — Telephone Encounter (Signed)
Copied from CRM [id number]. Topic: Appointment Scheduling - Scheduling Inquiry for Clinic >> Jul 19, 2021 12:15 PM Elliot Gault wrote: Reason for CRM:  Patient states he is unable to reach Dr. Rosaura Carpenter office at [facility]. Patient would like to cancel his appointment. Patient would like to know if PCP has a way to reach specialist because the office is not picking up at [phone].

## 2023-11-07 ENCOUNTER — Emergency Department: Payer: Medicare PPO

## 2023-11-07 ENCOUNTER — Other Ambulatory Visit: Payer: Self-pay

## 2023-11-07 DIAGNOSIS — Y92410 Unspecified street and highway as the place of occurrence of the external cause: Secondary | ICD-10-CM | POA: Diagnosis not present

## 2023-11-07 DIAGNOSIS — M7918 Myalgia, other site: Secondary | ICD-10-CM | POA: Diagnosis present
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
## 2023-11-08 ENCOUNTER — Emergency Department
Admission: EM | Admit: 2023-11-08 | Discharge: 2023-11-08 | Disposition: A | Discharge status: Home / Self Care | Payer: Medicare PPO | Attending: Emergency Medicine | Admitting: Emergency Medicine

## 2023-11-08 DIAGNOSIS — M7918 Myalgia, other site: Secondary | ICD-10-CM

## 2023-11-08 NOTE — Discharge Instructions (Addendum)
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
## 2024-11-10 ENCOUNTER — Ambulatory Visit: Admitting: Family Medicine
